# Patient Record
Sex: Female | Born: 1975 | Race: Black or African American | Hispanic: No | Marital: Single | Smoking: Former smoker
Health system: Southern US, Community
[De-identification: ages and names within clinical notes are randomized; demographics above are authoritative.]

## PROBLEM LIST (undated history)

## (undated) DIAGNOSIS — Z8669 Personal history of other diseases of the nervous system and sense organs: Secondary | ICD-10-CM

## (undated) DIAGNOSIS — K219 Gastro-esophageal reflux disease without esophagitis: Secondary | ICD-10-CM

## (undated) HISTORY — PX: BREAST SURGERY: SHX581

---

## 2009-02-19 ENCOUNTER — Emergency Department (HOSPITAL_COMMUNITY): Admission: EM | Admit: 2009-02-19 | Discharge: 2009-02-19 | Payer: Self-pay | Admitting: Emergency Medicine

## 2010-07-05 LAB — COMPREHENSIVE METABOLIC PANEL
ALT: 24 U/L (ref 0–35)
AST: 24 U/L (ref 0–37)
Albumin: 4.3 g/dL (ref 3.5–5.2)
Chloride: 111 mEq/L (ref 96–112)
Creatinine, Ser: 0.56 mg/dL (ref 0.4–1.2)
GFR calc Af Amer: >60 mL/min (ref >60)
Sodium: 141 mEq/L (ref 135–145)
Total Bilirubin: 0.4 mg/dL (ref 0.3–1.2)

## 2010-07-05 LAB — DIFFERENTIAL
Eosinophils Relative: 1 % (ref 0–5)
Lymphocytes Relative: 19 % (ref 12–46)
Lymphs Abs: 1.9 10*3/uL (ref 0.7–4.0)
Monocytes Absolute: 0.3 10*3/uL (ref 0.1–1.0)

## 2010-07-05 LAB — URINALYSIS, ROUTINE W REFLEX MICROSCOPIC
Glucose, UA: NEGATIVE mg/dL
Hgb urine dipstick: NEGATIVE
Specific Gravity, Urine: 1.016 (ref 1.005–1.030)

## 2010-07-05 LAB — CBC
MCV: 98.1 fL (ref 78.0–100.0)
Platelets: 368 10*3/uL (ref 150–400)
WBC: 10.3 10*3/uL (ref 4.0–10.5)

## 2010-07-05 LAB — ETHANOL: Alcohol, Ethyl (B): 24 mg/dL — ABNORMAL HIGH (ref 0–10)

## 2010-08-16 ENCOUNTER — Emergency Department (HOSPITAL_COMMUNITY)
Admission: EM | Admit: 2010-08-16 | Discharge: 2010-08-16 | Disposition: A | Discharge status: Home / Self Care | Payer: Medicaid Other | Attending: Emergency Medicine | Admitting: Emergency Medicine

## 2010-08-16 DIAGNOSIS — K5289 Other specified noninfective gastroenteritis and colitis: Secondary | ICD-10-CM | POA: Insufficient documentation

## 2010-08-16 DIAGNOSIS — N390 Urinary tract infection, site not specified: Secondary | ICD-10-CM | POA: Insufficient documentation

## 2010-08-16 LAB — CBC
Hemoglobin: 14.3 g/dL (ref 12.0–15.0)
MCH: 32.4 pg (ref 26.0–34.0)
MCV: 92.8 fL (ref 78.0–100.0)
RBC: 4.42 MIL/uL (ref 3.87–5.11)

## 2010-08-16 LAB — COMPREHENSIVE METABOLIC PANEL
AST: 19 U/L (ref 0–37)
BUN: 15 mg/dL (ref 6–23)
CO2: 23 mEq/L (ref 19–32)
Chloride: 105 mEq/L (ref 96–112)
Creatinine, Ser: 0.77 mg/dL (ref 0.4–1.2)
GFR calc Af Amer: >60 mL/min (ref >60)
GFR calc non Af Amer: >60 mL/min (ref >60)
Total Bilirubin: 0.3 mg/dL (ref 0.3–1.2)

## 2010-08-16 LAB — URINALYSIS, ROUTINE W REFLEX MICROSCOPIC
Glucose, UA: NEGATIVE mg/dL
Ketones, ur: 40 mg/dL — AB
Leukocytes, UA: NEGATIVE
Protein, ur: 30 mg/dL — AB

## 2010-08-16 LAB — DIFFERENTIAL
Lymphocytes Relative: 18 % (ref 12–46)
Lymphs Abs: 1.6 10*3/uL (ref 0.7–4.0)
Monocytes Relative: 4 % (ref 3–12)
Neutro Abs: 7.1 10*3/uL (ref 1.7–7.7)
Neutrophils Relative %: 77 % (ref 43–77)

## 2010-08-16 LAB — URINE MICROSCOPIC-ADD ON

## 2010-08-17 LAB — URINE CULTURE: Culture  Setup Time: 201205160852

## 2011-03-06 ENCOUNTER — Encounter: Payer: Self-pay | Admitting: *Deleted

## 2011-03-06 ENCOUNTER — Emergency Department (HOSPITAL_COMMUNITY)
Admission: EM | Admit: 2011-03-06 | Discharge: 2011-03-06 | Disposition: A | Discharge status: Home / Self Care | Payer: Medicaid Other | Attending: Emergency Medicine | Admitting: Emergency Medicine

## 2011-03-06 ENCOUNTER — Emergency Department (HOSPITAL_COMMUNITY): Payer: Medicaid Other

## 2011-03-06 DIAGNOSIS — K5289 Other specified noninfective gastroenteritis and colitis: Secondary | ICD-10-CM | POA: Insufficient documentation

## 2011-03-06 DIAGNOSIS — K529 Noninfective gastroenteritis and colitis, unspecified: Secondary | ICD-10-CM

## 2011-03-06 DIAGNOSIS — R109 Unspecified abdominal pain: Secondary | ICD-10-CM | POA: Insufficient documentation

## 2011-03-06 DIAGNOSIS — R197 Diarrhea, unspecified: Secondary | ICD-10-CM | POA: Insufficient documentation

## 2011-03-06 DIAGNOSIS — R112 Nausea with vomiting, unspecified: Secondary | ICD-10-CM | POA: Insufficient documentation

## 2011-03-06 LAB — CBC
HCT: 40.8 % (ref 36.0–46.0)
MCH: 33.2 pg (ref 26.0–34.0)
MCHC: 35.5 g/dL (ref 30.0–36.0)
MCV: 93.4 fL (ref 78.0–100.0)
RDW: 13.6 % (ref 11.5–15.5)

## 2011-03-06 LAB — URINE MICROSCOPIC-ADD ON

## 2011-03-06 LAB — URINALYSIS, ROUTINE W REFLEX MICROSCOPIC
Glucose, UA: NEGATIVE mg/dL
Leukocytes, UA: NEGATIVE
Nitrite: NEGATIVE
Protein, ur: NEGATIVE mg/dL
pH: 7 (ref 5.0–8.0)

## 2011-03-06 LAB — DIFFERENTIAL
Basophils Absolute: 0 10*3/uL (ref 0.0–0.1)
Basophils Relative: 0 % (ref 0–1)
Eosinophils Relative: 1 % (ref 0–5)
Monocytes Absolute: 0.7 10*3/uL (ref 0.1–1.0)
Monocytes Relative: 6 % (ref 3–12)

## 2011-03-06 LAB — COMPREHENSIVE METABOLIC PANEL
AST: 25 U/L (ref 0–37)
Albumin: 4 g/dL (ref 3.5–5.2)
BUN: 14 mg/dL (ref 6–23)
Calcium: 9.6 mg/dL (ref 8.4–10.5)
Creatinine, Ser: 0.7 mg/dL (ref 0.50–1.10)
GFR calc non Af Amer: >90 mL/min (ref >90)

## 2011-03-06 LAB — LIPASE, BLOOD: Lipase: 27 U/L (ref 11–59)

## 2011-03-06 LAB — PREGNANCY, URINE: Preg Test, Ur: NEGATIVE

## 2011-03-06 MED ORDER — SODIUM CHLORIDE 0.9 % IV SOLN
Freq: Once | INTRAVENOUS | Status: AC
  Administered 2011-03-06: 04:00:00 via INTRAVENOUS

## 2011-03-06 MED ORDER — LORAZEPAM 2 MG/ML IJ SOLN
2.0000 mg | Freq: Once | INTRAMUSCULAR | Status: AC
  Administered 2011-03-06: 2 mg via INTRAVENOUS
  Filled 2011-03-06: qty 1

## 2011-03-06 MED ORDER — OXYCODONE-ACETAMINOPHEN 5-325 MG PO TABS: 2.0000 | ORAL_TABLET | ORAL | Status: AC | PRN

## 2011-03-06 MED ORDER — KETOROLAC TROMETHAMINE 30 MG/ML IJ SOLN
30.0000 mg | Freq: Once | INTRAMUSCULAR | Status: AC
  Administered 2011-03-06: 30 mg via INTRAVENOUS
  Filled 2011-03-06: qty 1

## 2011-03-06 MED ORDER — ONDANSETRON HCL 4 MG/2ML IJ SOLN
4.0000 mg | Freq: Once | INTRAMUSCULAR | Status: AC
  Administered 2011-03-06: 4 mg via INTRAVENOUS
  Filled 2011-03-06: qty 2

## 2011-03-06 NOTE — ED Notes (Signed)
PT unable to void at this time. 

## 2011-03-06 NOTE — ED Provider Notes (Signed)
History     CSN: [id number] Arrival date & time: 03/06/2011  2:51 AM   First MD Initiated Contact with Patient 03/06/11 [phone]      Chief Complaint  Patient presents with  . Nausea  . Emesis  . Diarrhea    (Consider location/radiation/quality/duration/timing/severity/associated sxs/prior treatment) HPI Comments: Started 2 hours pta with severe diarrhea, abdominal cramping.  Does not add much additional history as she is screaming, writhing, and is behaving dramatically.  Patient is a 35 y.o. female presenting with diarrhea and cramps. The history is provided by the patient.  Diarrhea The primary symptoms include abdominal pain, nausea and diarrhea. Primary symptoms do not include fever.  Abdominal Cramping The primary symptoms of the illness include abdominal pain, nausea and diarrhea. The primary symptoms of the illness do not include fever. The current episode started 1 to 2 hours ago. The onset of the illness was sudden. The problem has been rapidly worsening.    No past medical history on file.  No past surgical history on file.  No family history on file.  History  Substance Use Topics  . Smoking status: Not on file  . Smokeless tobacco: Not on file  . Alcohol Use: Not on file    OB History    No data available      Review of Systems  Constitutional: Negative for fever.  Gastrointestinal: Positive for nausea, abdominal pain and diarrhea.  All other systems reviewed and are negative.    Allergies  Penicillins  Home Medications  No current outpatient prescriptions on file.  BP 112/74  Pulse 66  Temp(Src) 97.4 F (36.3 C) (Oral)  Resp 22  SpO2 100%  Physical Exam  Nursing note and vitals reviewed. Constitutional: She is oriented to person, place, and time. She appears well-developed and well-nourished.       Screaming, yelling.  Neck: Normal range of motion. Neck supple.  Cardiovascular: Normal rate and regular rhythm.  Exam reveals no gallop and no  friction rub.   No murmur heard. Pulmonary/Chest: Effort normal and breath sounds normal. No respiratory distress. She has no wheezes.  Abdominal: Soft. Bowel sounds are normal. She exhibits no distension. There is no tenderness.  Musculoskeletal: Normal range of motion. She exhibits no edema.  Neurological: She is alert and oriented to person, place, and time.  Skin: Skin is warm and dry.    ED Course  Procedures (including critical care time)  Labs Reviewed - No data to display No results found.   No diagnosis found.    MDM  Feels fine after ivf and meds.  Has had this several times now where her abdomen cramps like this.  Seems to occur after eating lettuce or tomatoes.  Will treat with pain meds, recommend prilosec.        Geoffery Lyons, MD 03/06/11 [phone]

## 2011-03-06 NOTE — ED Notes (Signed)
Per EMS; pt from home; pt having n/v/d X2 hours, abd pain, upper gastric; had to clean pt on arrival

## 2012-01-15 ENCOUNTER — Emergency Department (INDEPENDENT_AMBULATORY_CARE_PROVIDER_SITE_OTHER)
Admission: EM | Admit: 2012-01-15 | Discharge: 2012-01-15 | Disposition: A | Discharge status: Home / Self Care | Payer: Self-pay | Source: Home / Self Care | Attending: Emergency Medicine | Admitting: Emergency Medicine

## 2012-01-15 ENCOUNTER — Encounter (HOSPITAL_COMMUNITY): Payer: Self-pay | Admitting: *Deleted

## 2012-01-15 DIAGNOSIS — G43909 Migraine, unspecified, not intractable, without status migrainosus: Secondary | ICD-10-CM

## 2012-01-15 HISTORY — DX: Gastro-esophageal reflux disease without esophagitis: K21.9

## 2012-01-15 HISTORY — DX: Personal history of other diseases of the nervous system and sense organs: Z86.69

## 2012-01-15 MED ORDER — DIPHENHYDRAMINE HCL 50 MG/ML IJ SOLN
INTRAMUSCULAR | Status: AC
  Filled 2012-01-15: qty 1

## 2012-01-15 MED ORDER — KETOROLAC TROMETHAMINE 60 MG/2ML IM SOLN
60.0000 mg | Freq: Once | INTRAMUSCULAR | Status: AC
  Administered 2012-01-15: 60 mg via INTRAMUSCULAR

## 2012-01-15 MED ORDER — SUMATRIPTAN SUCCINATE 50 MG PO TABS: 50.0000 mg | ORAL_TABLET | ORAL | Status: DC | PRN

## 2012-01-15 MED ORDER — DIPHENHYDRAMINE HCL 50 MG/ML IJ SOLN
25.0000 mg | Freq: Once | INTRAMUSCULAR | Status: AC
  Administered 2012-01-15: 25 mg via INTRAMUSCULAR

## 2012-01-15 MED ORDER — KETOROLAC TROMETHAMINE 60 MG/2ML IM SOLN
INTRAMUSCULAR | Status: AC
  Filled 2012-01-15: qty 2

## 2012-01-15 MED ORDER — DEXAMETHASONE SODIUM PHOSPHATE 10 MG/ML IJ SOLN
INTRAMUSCULAR | Status: AC
  Filled 2012-01-15: qty 1

## 2012-01-15 MED ORDER — DEXAMETHASONE SODIUM PHOSPHATE 10 MG/ML IJ SOLN
10.0000 mg | Freq: Once | INTRAMUSCULAR | Status: AC
  Administered 2012-01-15: 10 mg via INTRAMUSCULAR

## 2012-01-15 NOTE — ED Notes (Signed)
C/o photophobia and blurry vision.  Lights out in the room and pt. wearing sunglasses.

## 2012-01-15 NOTE — ED Provider Notes (Signed)
Chief Complaint  Patient presents with  . Migraine    History of Present Illness:   Kristin Huynh is a 36 year old female who has had a two-year history of migraine headaches. She had been referred to a neurologist but never went. Her migraines vary in frequency, the previous one being this past Sunday, 3 days ago. This went away, but around 3 PM today she noted severe, throbbing, right temporal pain without radiation. There was no obvious precipitating factor. She denies any nausea or vomiting but does have photophobia and phonophobia. The vision in her right eye seems blurry and she's had some numbness around the right eye. She denies any fever, chills, or stiff neck. She's had no numbness or tingling of the arms or legs or focal weakness of the arms or legs. She denies any difficulty with speech, swallowing, or ambulation. She has never taken any prescription medicine for the migraines and has never taken any medicine for migraine prevention.  Review of Systems:  Other than noted above, the patient denies any of the following symptoms: Systemic:  No fever, chills, fatigue, photophobia, stiff neck. Eye:  No redness, eye pain, discharge, blurred vision, or diplopia. ENT:  No nasal congestion, rhinorrhea, sinus pressure or pain, sneezing, earache, or sore throat.  No jaw claudication. Neuro:  No paresthesias, loss of consciousness, seizure activity, muscle weakness, trouble with coordination or gait, trouble speaking or swallowing. Psych:  No depression, anxiety or trouble sleeping.  PMFSH:  Past medical history, family history, social history, meds, and allergies were reviewed.  Physical Exam:   Vital signs:  BP 120/73  Pulse 66  Temp 98 F (36.7 C) (Oral)  Resp 16  SpO2 100%  LMP 01/07/2012 General:  Alert and oriented.  In no distress. Eye:  Lids and conjunctivas normal.  PERRL,  Full EOMs.  Fundi benign with normal discs and vessels. ENT:  No cranial or facial tenderness to palpation.  TMs and  canals clear.  Nasal mucosa was normal and uncongested without any drainage. No intra oral lesions, pharynx clear, mucous membranes moist, dentition normal. Neck:  Supple, full ROM, no tenderness to palpation.  No adenopathy or mass. Neuro:  Alert and orented times 3.  Speech was clear, fluent, and appropriate.  Cranial nerves intact. No pronator drift, muscle strength normal. Finger to nose normal.  DTRs were 2+ and symmetrical.Station and gait were normal.  Romberg's sign was normal.  Able to perform tandem gait well. Psych:  Normal affect.  Medications given in [facility]:  She was given Decadron 10 mg IM, Toradol 60 mg IM, and Benadryl 25 mg IM. She tolerated these all well without any immediate side effects.  Assessment:  The encounter diagnosis was Migraine headache.  Plan:   1.  The following meds were prescribed:   New Prescriptions   SUMATRIPTAN (IMITREX) 50 MG TABLET    Take 1 tablet (50 mg total) by mouth every 2 (two) hours as needed for migraine.   2.  The patient was instructed in symptomatic care and handouts were given. 3.  The patient was told to return if becoming worse in any way, if no better in 3 or 4 days, and given some red flag symptoms that would indicate earlier return.  Follow up:  The patient was told to follow up with Dr. Porfirio Mylar Dohmeier for followup on migraine headaches.     Reuben Likes, MD 01/15/12 2122

## 2012-01-15 NOTE — ED Notes (Signed)
C/o migraine headache in R temple onset 1500 while sitting down. Had one on Sunday also on R side.  No known triggers. Has not seen a neurologist. She was referred last year but never went.  Took Aleve @ 1500 without relief.

## 2012-03-07 ENCOUNTER — Encounter (HOSPITAL_COMMUNITY): Payer: Self-pay | Admitting: Cardiology

## 2012-03-07 ENCOUNTER — Emergency Department (HOSPITAL_COMMUNITY): Payer: Medicaid Other

## 2012-03-07 ENCOUNTER — Emergency Department (HOSPITAL_COMMUNITY)
Admission: EM | Admit: 2012-03-07 | Discharge: 2012-03-07 | Disposition: A | Discharge status: Home / Self Care | Payer: Medicaid Other | Attending: Emergency Medicine | Admitting: Emergency Medicine

## 2012-03-07 DIAGNOSIS — R197 Diarrhea, unspecified: Secondary | ICD-10-CM | POA: Insufficient documentation

## 2012-03-07 DIAGNOSIS — Z8719 Personal history of other diseases of the digestive system: Secondary | ICD-10-CM | POA: Insufficient documentation

## 2012-03-07 DIAGNOSIS — Z3202 Encounter for pregnancy test, result negative: Secondary | ICD-10-CM | POA: Insufficient documentation

## 2012-03-07 DIAGNOSIS — R1110 Vomiting, unspecified: Secondary | ICD-10-CM

## 2012-03-07 DIAGNOSIS — R509 Fever, unspecified: Secondary | ICD-10-CM | POA: Insufficient documentation

## 2012-03-07 DIAGNOSIS — R109 Unspecified abdominal pain: Secondary | ICD-10-CM

## 2012-03-07 DIAGNOSIS — R112 Nausea with vomiting, unspecified: Secondary | ICD-10-CM | POA: Insufficient documentation

## 2012-03-07 DIAGNOSIS — G43909 Migraine, unspecified, not intractable, without status migrainosus: Secondary | ICD-10-CM | POA: Insufficient documentation

## 2012-03-07 DIAGNOSIS — R1084 Generalized abdominal pain: Secondary | ICD-10-CM | POA: Insufficient documentation

## 2012-03-07 DIAGNOSIS — R61 Generalized hyperhidrosis: Secondary | ICD-10-CM | POA: Insufficient documentation

## 2012-03-07 DIAGNOSIS — F17200 Nicotine dependence, unspecified, uncomplicated: Secondary | ICD-10-CM | POA: Insufficient documentation

## 2012-03-07 LAB — URINALYSIS, ROUTINE W REFLEX MICROSCOPIC
Glucose, UA: NEGATIVE mg/dL
Leukocytes, UA: NEGATIVE
Nitrite: NEGATIVE
Specific Gravity, Urine: 1.029 (ref 1.005–1.030)
pH: 6 (ref 5.0–8.0)

## 2012-03-07 LAB — CBC WITH DIFFERENTIAL/PLATELET
Basophils Relative: 1 % (ref 0–1)
Eosinophils Absolute: 0.2 10*3/uL (ref 0.0–0.7)
Eosinophils Relative: 2 % (ref 0–5)
Hemoglobin: 14.5 g/dL (ref 12.0–15.0)
MCH: 32.7 pg (ref 26.0–34.0)
MCHC: 34.6 g/dL (ref 30.0–36.0)
MCV: 94.6 fL (ref 78.0–100.0)
Monocytes Relative: 8 % (ref 3–12)
Neutrophils Relative %: 49 % (ref 43–77)

## 2012-03-07 LAB — URINE MICROSCOPIC-ADD ON

## 2012-03-07 LAB — COMPREHENSIVE METABOLIC PANEL
Albumin: 4.5 g/dL (ref 3.5–5.2)
Alkaline Phosphatase: 49 U/L (ref 39–117)
BUN: 11 mg/dL (ref 6–23)
Calcium: 9.9 mg/dL (ref 8.4–10.5)
Creatinine, Ser: 0.65 mg/dL (ref 0.50–1.10)
GFR calc Af Amer: >90 mL/min (ref >90)
Glucose, Bld: 105 mg/dL — ABNORMAL HIGH (ref 70–99)
Potassium: 3.4 mEq/L — ABNORMAL LOW (ref 3.5–5.1)
Total Protein: 8.1 g/dL (ref 6.0–8.3)

## 2012-03-07 LAB — POCT PREGNANCY, URINE: Preg Test, Ur: NEGATIVE

## 2012-03-07 MED ORDER — OXYCODONE-ACETAMINOPHEN 5-325 MG PO TABS: ORAL_TABLET | ORAL | Status: DC

## 2012-03-07 MED ORDER — ONDANSETRON HCL 4 MG/2ML IJ SOLN
4.0000 mg | Freq: Once | INTRAMUSCULAR | Status: AC
  Administered 2012-03-07: 4 mg via INTRAVENOUS
  Filled 2012-03-07: qty 2

## 2012-03-07 MED ORDER — DIPHENHYDRAMINE HCL 50 MG/ML IJ SOLN
25.0000 mg | Freq: Once | INTRAMUSCULAR | Status: AC
  Administered 2012-03-07: 25 mg via INTRAVENOUS
  Filled 2012-03-07: qty 1

## 2012-03-07 MED ORDER — ONDANSETRON HCL 4 MG PO TABS: 4.0000 mg | ORAL_TABLET | Freq: Three times a day (TID) | ORAL | Status: DC | PRN

## 2012-03-07 MED ORDER — MORPHINE SULFATE 4 MG/ML IJ SOLN
4.0000 mg | Freq: Once | INTRAMUSCULAR | Status: AC
  Administered 2012-03-07: 4 mg via INTRAVENOUS
  Filled 2012-03-07: qty 1

## 2012-03-07 MED ORDER — METOCLOPRAMIDE HCL 5 MG/ML IJ SOLN
10.0000 mg | Freq: Once | INTRAMUSCULAR | Status: AC
  Administered 2012-03-07: 10 mg via INTRAVENOUS
  Filled 2012-03-07: qty 2

## 2012-03-07 MED ORDER — METOCLOPRAMIDE HCL 5 MG/ML IJ SOLN: 10.0000 mg | Freq: Once | INTRAMUSCULAR | Status: DC

## 2012-03-07 MED ORDER — SODIUM CHLORIDE 0.9 % IV BOLUS (SEPSIS)
1000.0000 mL | Freq: Once | INTRAVENOUS | Status: AC
  Administered 2012-03-07: 1000 mL via INTRAVENOUS

## 2012-03-07 MED ORDER — IOHEXOL 300 MG/ML  SOLN
20.0000 mL | INTRAMUSCULAR | Status: AC
  Administered 2012-03-07: 20 mL via ORAL

## 2012-03-07 NOTE — ED Notes (Signed)
Pt reports n/v and headache that started last night. Reports chills and fever. Also reports abd pain.

## 2012-03-07 NOTE — ED Provider Notes (Signed)
Kristin Huynh is a 36 y.o. female transferred to CDU from [facility] B. Signout from [facility] as follows: Patient with chronic headache exacerbation nausea vomiting and abdominal pain starting last night she also reports subjective fever and chills. She is transferred to the CDU pending blood work and CT abdomen pelvis.   Pt seen and examined at the bedside she is resting comfortably, tolerating by mouth and reports complete resolution of abdominal pain at this time. Lung sounds are clear bilaterally, heart is a regular rate and rhythm with no murmurs rubs or gallops, abdominal exam shows mild tenderness to palpation of bilateral upper quadrant and no masses or rebound.  Patient is refusing abdominal CT. She states that she's had this several times in the past always comes back negative. This patient has a nonsurgical abdomen I think this is a reasonable request. I have advised her that we can do an abdominal ultrasound with no radiation. Patient is amenable to this option.  Bloodwork, urine and abdominal ultrasound show no abnormality. Patient's tolerating by mouth and had significant resolution in pain. I have advised her to return to the emergency room immediately for any worsening of symptoms.   Pt verbalized understanding and agrees with care plan. Outpatient follow-up and return precautions given.    New Prescriptions   ONDANSETRON (ZOFRAN) 4 MG TABLET    Take 1 tablet (4 mg total) by mouth every 8 (eight) hours as needed for nausea.   OXYCODONE-ACETAMINOPHEN (PERCOCET/ROXICET) 5-325 MG PER TABLET    1 to 2 tabs PO q6hrs  PRN for pain     Results for orders placed during the hospital encounter of 03/07/12  CBC WITH DIFFERENTIAL      Component Value Range   WBC 8.8  4.0 - 10.5 K/uL   RBC 4.43  3.87 - 5.11 MIL/uL   Hemoglobin 14.5  12.0 - 15.0 g/dL   HCT 16.1  09.6 - 04.5 %   MCV 94.6  78.0 - 100.0 fL   MCH 32.7  26.0 - 34.0 pg   MCHC 34.6  30.0 - 36.0 g/dL   RDW 40.9  81.1 - 91.4 %   Platelets  357  150 - 400 K/uL   Neutrophils Relative 49  43 - 77 %   Neutro Abs 4.3  1.7 - 7.7 K/uL   Lymphocytes Relative 41  12 - 46 %   Lymphs Abs 3.6  0.7 - 4.0 K/uL   Monocytes Relative 8  3 - 12 %   Monocytes Absolute 0.7  0.1 - 1.0 K/uL   Eosinophils Relative 2  0 - 5 %   Eosinophils Absolute 0.2  0.0 - 0.7 K/uL   Basophils Relative 1  0 - 1 %   Basophils Absolute 0.1  0.0 - 0.1 K/uL  COMPREHENSIVE METABOLIC PANEL      Component Value Range   Sodium 139  135 - 145 mEq/L   Potassium 3.4 (*) 3.5 - 5.1 mEq/L   Chloride 103  96 - 112 mEq/L   CO2 20  19 - 32 mEq/L   Glucose, Bld 105 (*) 70 - 99 mg/dL   BUN 11  6 - 23 mg/dL   Creatinine, Ser 7.82  0.50 - 1.10 mg/dL   Calcium 9.9  8.4 - 95.6 mg/dL   Total Protein 8.1  6.0 - 8.3 g/dL   Albumin 4.5  3.5 - 5.2 g/dL   AST 22  0 - 37 U/L   ALT 15  0 - 35 U/L  Alkaline Phosphatase 49  39 - 117 U/L   Total Bilirubin 0.4  0.3 - 1.2 mg/dL   GFR calc non Af Amer >90  >90 mL/min   GFR calc Af Amer >90  >90 mL/min  URINALYSIS, ROUTINE W REFLEX MICROSCOPIC      Component Value Range   Color, Urine YELLOW  YELLOW   APPearance HAZY (*) CLEAR   Specific Gravity, Urine 1.029  1.005 - 1.030   pH 6.0  5.0 - 8.0   Glucose, UA NEGATIVE  NEGATIVE mg/dL   Hgb urine dipstick MODERATE (*) NEGATIVE   Bilirubin Urine SMALL (*) NEGATIVE   Ketones, ur >80 (*) NEGATIVE mg/dL   Protein, ur NEGATIVE  NEGATIVE mg/dL   Urobilinogen, UA 1.0  0.0 - 1.0 mg/dL   Nitrite NEGATIVE  NEGATIVE   Leukocytes, UA NEGATIVE  NEGATIVE  POCT PREGNANCY, URINE      Component Value Range   Preg Test, Ur NEGATIVE  NEGATIVE  URINE MICROSCOPIC-ADD ON      Component Value Range   Squamous Epithelial / LPF MANY (*) RARE   WBC, UA 0-2  <3 WBC/hpf   RBC / HPF 3-6  <3 RBC/hpf   Bacteria, UA RARE  RARE   Urine-Other MUCOUS PRESENT     US Abdomen Complete  03/07/2012  *RADIOLOGY REPORT*  Clinical Data:  Abdominal pain and nausea vomiting.  COMPLETE ABDOMINAL ULTRASOUND  Comparison:   CT abdomen pelvis 03/05/2012.  Findings:  Gallbladder:  No gallstones, gallbladder wall thickening, or pericholecystic fluid.  Common bile duct:  1.7 mm.  Liver:  No focal lesion identified.  Within normal limits in parenchymal echogenicity.  IVC:  Appears normal.  Pancreas:  No focal abnormality seen.  Spleen:  7.3 cm.  Right Kidney:  11.0 cm. No hydronephrosis or renal mass.  Left Kidney:  11.8 cm. No hydronephrosis or renal mass.  Abdominal aorta:  No aneurysm identified.  IMPRESSION: Negative abdominal ultrasound.   Original Report Authenticated By: Lacy Duverney, M.D.     Wynetta Emery, PA-C 03/07/12 1547

## 2012-03-07 NOTE — ED Provider Notes (Signed)
History     CSN: [id number]  Arrival date & time 03/07/12  4010   First MD Initiated Contact with Patient 03/07/12 0802      Chief Complaint  Patient presents with  . Emesis  . Headache    (Consider location/radiation/quality/duration/timing/severity/associated sxs/prior treatment) HPI Comments: Patient is a 36 year old female with a history of migraines and acid reflux presents emergency department with chief complaint of emesis and abdominal pain.  Onset of symptoms began last evening around 8 p.m. abdominal pain is described as cramping sensation that is diffuse in nature.  Associated symptoms include headache, diarrhea, hyperemesis, night sweats, chills, fever or.  Patient denies sick contacts, localized abdominal pain, vaginal dc, urinary symptoms, melena, hematochezia, or hematemesis.   The history is provided by the patient.    Past Medical History  Diagnosis Date  . Hx of migraine headaches   . GERD (gastroesophageal reflux disease)     Past Surgical History  Procedure Date  . Breast surgery     R breast mass removed- benign    History reviewed. No pertinent family history.  History  Substance Use Topics  . Smoking status: Current Every Day Smoker -- 0.2 packs/day    Types: Cigarettes  . Smokeless tobacco: Not on file  . Alcohol Use: No    OB History    Grav Para Term Preterm Abortions TAB SAB Ect Mult Living                  Review of Systems  Constitutional: Positive for fever, chills and diaphoresis. Negative for appetite change.  HENT: Negative for congestion, neck stiffness and dental problem.   Eyes: Negative for visual disturbance.  Respiratory: Negative for cough, chest tightness, shortness of breath and wheezing.   Cardiovascular: Negative for chest pain and leg swelling.  Gastrointestinal: Positive for nausea, vomiting and diarrhea. Negative for abdominal pain, constipation, blood in stool, abdominal distention, anal bleeding and rectal pain.   Genitourinary: Negative for dysuria, urgency, frequency, hematuria and flank pain.  Musculoskeletal: Negative for myalgias and arthralgias.  Skin: Negative for rash.  Neurological: Positive for headaches. Negative for dizziness, syncope, speech difficulty, weakness, light-headedness and numbness.  Hematological: Does not bruise/bleed easily.  Psychiatric/Behavioral: Negative for confusion.  All other systems reviewed and are negative.    Allergies  Penicillins  Home Medications   Current Outpatient Rx  Name  Route  Sig  Dispense  Refill  . SUMATRIPTAN SUCCINATE 50 MG PO TABS   Oral   Take 1 tablet (50 mg total) by mouth every 2 (two) hours as needed for migraine.   10 tablet   0     BP 145/98  Pulse 66  Temp 99.1 F (37.3 C) (Oral)  Resp 18  SpO2 100%  Physical Exam  Nursing note and vitals reviewed. Constitutional: Vital signs are normal. She appears well-developed and well-nourished. No distress.  HENT:  Head: Normocephalic and atraumatic.  Mouth/Throat: Uvula is midline, oropharynx is clear and moist and mucous membranes are normal.  Eyes: Conjunctivae normal and EOM are normal. Pupils are equal, round, and reactive to light.  Neck: Normal range of motion and full passive range of motion without pain. Neck supple. No spinous process tenderness and no muscular tenderness present. No rigidity. No Brudzinski's sign noted.  Cardiovascular: Normal rate and regular rhythm.   Pulmonary/Chest: Effort normal and breath sounds normal. No accessory muscle usage. Not tachypneic. No respiratory distress.  Abdominal: Soft. Normal appearance. She exhibits no distension, no ascites,  no pulsatile midline mass and no mass. There is tenderness (diffuse). There is no CVA tenderness. No hernia.  Lymphadenopathy:    She has no cervical adenopathy.  Neurological: She is alert.  Skin: Skin is warm and dry. No rash noted. She is not diaphoretic.  Psychiatric: She has a normal mood and  affect. Her speech is normal and behavior is normal.    ED Course  Procedures (including critical care time)   Labs Reviewed  CBC WITH DIFFERENTIAL  COMPREHENSIVE METABOLIC PANEL  URINALYSIS, ROUTINE W REFLEX MICROSCOPIC   No results found.   No diagnosis found.   BP 145/98  Pulse 66  Temp 99.1 F (37.3 C) (Oral)  Resp 18  SpO2 100%   MDM  Abdominal pain  Pt is seen and examined; Initial history and physical IV fluids, pain meds & antiemetics given. Labs and urine tests have been ordered and pending. Disposition will be pending lab studies and reassessment. Will be signed out to the CDU provider. Pt appears stable for move at this time          Jaci Carrel, [place] 03/13/12 1551

## 2012-03-07 NOTE — ED Notes (Signed)
Sprite given 

## 2012-03-07 NOTE — ED Notes (Signed)
Pt.  Unable to drink the contrast, she reports that it is making her pain increase and also making her nauseated and vomiting.

## 2012-03-07 NOTE — ED Notes (Signed)
Spoke with Lab, They are still working on the urinalysis, reported to PA, [employer]

## 2012-03-13 NOTE — ED Provider Notes (Signed)
Medical screening examination/treatment/procedure(s) were performed by non-physician practitioner and as supervising physician I was immediately available for consultation/collaboration.  Jones Skene, M.D.     Jones Skene, MD 03/13/12 1635

## 2012-03-20 NOTE — ED Provider Notes (Signed)
Medical screening examination/treatment/procedure(s) were performed by non-physician practitioner and as supervising physician I was immediately available for consultation/collaboration.  John-Adam Nakeisha Greenhouse, M.D.     John-Adam Layni Kreamer, MD 03/20/12 1630 

## 2012-06-28 ENCOUNTER — Encounter (HOSPITAL_COMMUNITY): Payer: Self-pay | Admitting: *Deleted

## 2012-06-28 ENCOUNTER — Encounter (HOSPITAL_COMMUNITY): Payer: Self-pay | Admitting: Cardiology

## 2012-06-28 ENCOUNTER — Emergency Department (HOSPITAL_COMMUNITY)
Admission: EM | Admit: 2012-06-28 | Discharge: 2012-06-28 | Discharge status: Against Medical Advice | Payer: Medicaid Other | Source: Home / Self Care

## 2012-06-28 ENCOUNTER — Emergency Department (HOSPITAL_COMMUNITY)
Admission: EM | Admit: 2012-06-28 | Discharge: 2012-06-28 | Disposition: A | Discharge status: Home / Self Care | Payer: Medicaid Other | Attending: Emergency Medicine | Admitting: Emergency Medicine

## 2012-06-28 DIAGNOSIS — Z8719 Personal history of other diseases of the digestive system: Secondary | ICD-10-CM | POA: Insufficient documentation

## 2012-06-28 DIAGNOSIS — R51 Headache: Secondary | ICD-10-CM | POA: Insufficient documentation

## 2012-06-28 DIAGNOSIS — R112 Nausea with vomiting, unspecified: Secondary | ICD-10-CM | POA: Insufficient documentation

## 2012-06-28 DIAGNOSIS — R109 Unspecified abdominal pain: Secondary | ICD-10-CM | POA: Insufficient documentation

## 2012-06-28 DIAGNOSIS — G43909 Migraine, unspecified, not intractable, without status migrainosus: Secondary | ICD-10-CM | POA: Insufficient documentation

## 2012-06-28 DIAGNOSIS — F17200 Nicotine dependence, unspecified, uncomplicated: Secondary | ICD-10-CM | POA: Insufficient documentation

## 2012-06-28 DIAGNOSIS — Z3202 Encounter for pregnancy test, result negative: Secondary | ICD-10-CM | POA: Insufficient documentation

## 2012-06-28 LAB — CBC WITH DIFFERENTIAL/PLATELET
Basophils Absolute: 0 10*3/uL (ref 0.0–0.1)
Basophils Relative: 1 % (ref 0–1)
Eosinophils Relative: 1 % (ref 0–5)
Lymphocytes Relative: 18 % (ref 12–46)
MCHC: 35 g/dL (ref 30.0–36.0)
MCV: 95.6 fL (ref 78.0–100.0)
Monocytes Absolute: 0.3 10*3/uL (ref 0.1–1.0)
Neutro Abs: 5.8 10*3/uL (ref 1.7–7.7)
Platelets: 327 10*3/uL (ref 150–400)
RDW: 13.6 % (ref 11.5–15.5)
WBC: 7.6 10*3/uL (ref 4.0–10.5)

## 2012-06-28 LAB — URINALYSIS, ROUTINE W REFLEX MICROSCOPIC
Ketones, ur: 15 mg/dL — AB
Leukocytes, UA: NEGATIVE
Protein, ur: NEGATIVE mg/dL
Urobilinogen, UA: 0.2 mg/dL (ref 0.0–1.0)

## 2012-06-28 LAB — LIPASE, BLOOD: Lipase: 16 U/L (ref 11–59)

## 2012-06-28 MED ORDER — DIPHENHYDRAMINE HCL 50 MG/ML IJ SOLN
25.0000 mg | Freq: Once | INTRAMUSCULAR | Status: AC
  Administered 2012-06-28: 25 mg via INTRAVENOUS
  Filled 2012-06-28: qty 1

## 2012-06-28 MED ORDER — SODIUM CHLORIDE 0.9 % IV SOLN
INTRAVENOUS | Status: DC
  Administered 2012-06-28: 15:00:00 via INTRAVENOUS

## 2012-06-28 MED ORDER — KETOROLAC TROMETHAMINE 30 MG/ML IJ SOLN
30.0000 mg | Freq: Once | INTRAMUSCULAR | Status: AC
  Administered 2012-06-28: 30 mg via INTRAVENOUS
  Filled 2012-06-28: qty 1

## 2012-06-28 MED ORDER — ONDANSETRON HCL 4 MG/2ML IJ SOLN
4.0000 mg | Freq: Once | INTRAMUSCULAR | Status: DC
  Filled 2012-06-28: qty 2

## 2012-06-28 MED ORDER — METOCLOPRAMIDE HCL 5 MG/ML IJ SOLN
10.0000 mg | Freq: Once | INTRAMUSCULAR | Status: AC
  Administered 2012-06-28: 10 mg via INTRAVENOUS
  Filled 2012-06-28: qty 2

## 2012-06-28 MED ORDER — ONDANSETRON HCL 4 MG PO TABS: 4.0000 mg | ORAL_TABLET | Freq: Three times a day (TID) | ORAL | Status: DC | PRN

## 2012-06-28 NOTE — ED Notes (Signed)
[facility] ED called, patient is now in their department.

## 2012-06-28 NOTE — ED Notes (Signed)
Pt reports onset of migraine headache this morning, developed n/v and abd pain. Reports taking otc migraine meds without relief of symptoms. Hx of migraines - reports vomiting 4 times today. 10/10 sharp pain mid abdomen

## 2012-06-28 NOTE — ED Provider Notes (Signed)
History     CSN: [id number]  Arrival date & time 06/28/12  1418   First MD Initiated Contact with Patient 06/28/12 1422      Chief Complaint  Patient presents with  . Abdominal Pain  . Migraine    (Consider location/radiation/quality/duration/timing/severity/associated sxs/prior treatment) HPI Comments: [name], past medical history remarkable for migraine headaches and GERD, who presents emergency department with chief complaint of headache. Patient states the headache started this morning. It is worsened throughout the day. She states that it is 10 out of 10. She also endorses associated nausea and vomiting. She also states that she is having sharp mid epigastric abdominal pain. She has not tried taking anything to alleviate her symptoms. She denies any measured fever, however she reports subjective fever and chills. She denies any blood in her emesis or stool.    The history is provided by the patient. No language interpreter was used.    Past Medical History  Diagnosis Date  . Hx of migraine headaches   . GERD (gastroesophageal reflux disease)     Past Surgical History  Procedure Laterality Date  . Breast surgery      R breast mass removed- benign    No family history on file.  History  Substance Use Topics  . Smoking status: Current Every Day Smoker -- 0.25 packs/day    Types: Cigarettes  . Smokeless tobacco: Not on file  . Alcohol Use: Yes    OB History   Grav Para Term Preterm Abortions TAB SAB Ect Mult Living                  Review of Systems  All other systems reviewed and are negative.    Allergies  Penicillins  Home Medications   Current Outpatient Rx  Name  Route  Sig  Dispense  Refill  . ondansetron (ZOFRAN) 4 MG tablet   Oral   Take 1 tablet (4 mg total) by mouth every 8 (eight) hours as needed for nausea.   10 tablet   0   . oxyCODONE-acetaminophen (PERCOCET/ROXICET) 5-325 MG per tablet      1 to 2 tabs PO q6hrs   PRN for pain   15 tablet   0   . SUMAtriptan (IMITREX) 50 MG tablet   Oral   Take 1 tablet (50 mg total) by mouth every 2 (two) hours as needed for migraine.   10 tablet   0     BP 157/92  Pulse 65  Temp(Src) 97.5 F (36.4 C) (Oral)  Resp 20  SpO2 100%  LMP 06/20/2012  Physical Exam  Nursing note and vitals reviewed. Constitutional: She is oriented to person, place, and time. She appears well-developed and well-nourished.  HENT:  Head: Normocephalic and atraumatic.  Right Ear: External ear normal.  Left Ear: External ear normal.  Non-tender over temporal artery, no increased pain with chewing.  Eyes: Conjunctivae and EOM are normal. Pupils are equal, round, and reactive to light.  No papilledema  Neck: Normal range of motion. Neck supple.  No pain with neck flexion, no meningismus  Cardiovascular: Normal rate, regular rhythm and normal heart sounds.  Exam reveals no gallop and no friction rub.   No murmur heard. Pulmonary/Chest: Effort normal and breath sounds normal. No respiratory distress. She has no wheezes. She has no rales. She exhibits no tenderness.  Abdominal: Soft. Bowel sounds are normal. She exhibits no distension and no mass. There is no tenderness. There is  no rebound and no guarding.  Diffuse, crampy, abdominal pain, not well localized  Musculoskeletal: Normal range of motion. She exhibits no edema and no tenderness.  Normal gait.  Neurological: She is alert and oriented to person, place, and time. She has normal reflexes.  CN 3-12 intact, no pronator drift, normal shin to heel, normal RAM, sensation and strength intact bilaterally.  Skin: Skin is warm and dry.  Psychiatric: She has a normal mood and affect. Her behavior is normal. Judgment and thought content normal.    ED Course  Procedures (including critical care time)  Labs Reviewed  URINALYSIS, ROUTINE W REFLEX MICROSCOPIC  CBC WITH DIFFERENTIAL   Results for orders placed during the hospital  encounter of 06/28/12  URINALYSIS, ROUTINE W REFLEX MICROSCOPIC      Result Value Range   Color, Urine YELLOW  YELLOW   APPearance CLEAR  CLEAR   Specific Gravity, Urine 1.023  1.005 - 1.030   pH 7.0  5.0 - 8.0   Glucose, UA NEGATIVE  NEGATIVE mg/dL   Hgb urine dipstick NEGATIVE  NEGATIVE   Bilirubin Urine NEGATIVE  NEGATIVE   Ketones, ur 15 (*) NEGATIVE mg/dL   Protein, ur NEGATIVE  NEGATIVE mg/dL   Urobilinogen, UA 0.2  0.0 - 1.0 mg/dL   Nitrite NEGATIVE  NEGATIVE   Leukocytes, UA NEGATIVE  NEGATIVE  CBC WITH DIFFERENTIAL      Result Value Range   WBC 7.6  4.0 - 10.5 K/uL   RBC 4.28  3.87 - 5.11 MIL/uL   Hemoglobin 14.3  12.0 - 15.0 g/dL   HCT 14.7  82.9 - 56.2 %   MCV 95.6  78.0 - 100.0 fL   MCH 33.4  26.0 - 34.0 pg   MCHC 35.0  30.0 - 36.0 g/dL   RDW 13.0  86.5 - 78.4 %   Platelets 327  150 - 400 K/uL   Neutrophils Relative 76  43 - 77 %   Neutro Abs 5.8  1.7 - 7.7 K/uL   Lymphocytes Relative 18  12 - 46 %   Lymphs Abs 1.4  0.7 - 4.0 K/uL   Monocytes Relative 4  3 - 12 %   Monocytes Absolute 0.3  0.1 - 1.0 K/uL   Eosinophils Relative 1  0 - 5 %   Eosinophils Absolute 0.1  0.0 - 0.7 K/uL   Basophils Relative 1  0 - 1 %   Basophils Absolute 0.0  0.0 - 0.1 K/uL  LIPASE, BLOOD      Result Value Range   Lipase 16  11 - 59 U/L  POCT PREGNANCY, URINE      Result Value Range   Preg Test, Ur NEGATIVE  NEGATIVE       1. Headache   2. Nausea and vomiting       MDM  [name] with headache, and nausea and vomiting. Patient has a history of migraine headaches. She believes that this headache is causing her nausea and vomiting.  The headache was not a thunderclap headache, no meningismus. No fevers.  Labs are reassuring.  Will give migraine cocktail and re-evaluate.  3:21 PM Recheck of abdomen.  Still soft and no localized tenderness.  Suspect that the crampy abdominal pain is due to the vomiting.  Patient feeling significantly better with migraine  cocktail.   Will discharge the patient with zofran and return precautions.     Roxy Horseman, PA-C 06/28/12 1657

## 2012-06-28 NOTE — ED Notes (Signed)
Pt reports she developed a headache this morning and has also had some nausea with vomiting. States she tried to take her migraine medication without much relief. States she is sensitive to light. Reports lq abd pain.

## 2012-06-30 NOTE — ED Provider Notes (Signed)
Medical screening examination/treatment/procedure(s) were performed by non-physician practitioner and as supervising physician I was immediately available for consultation/collaboration.   Laray Anger, DO 06/30/12 1118

## 2012-12-31 ENCOUNTER — Encounter (HOSPITAL_COMMUNITY): Payer: Self-pay | Admitting: Emergency Medicine

## 2012-12-31 ENCOUNTER — Emergency Department (HOSPITAL_COMMUNITY)
Admission: EM | Admit: 2012-12-31 | Discharge: 2013-01-01 | Discharge status: Against Medical Advice | Payer: Medicaid Other | Attending: Emergency Medicine | Admitting: Emergency Medicine

## 2012-12-31 DIAGNOSIS — F17200 Nicotine dependence, unspecified, uncomplicated: Secondary | ICD-10-CM | POA: Insufficient documentation

## 2012-12-31 DIAGNOSIS — R55 Syncope and collapse: Secondary | ICD-10-CM | POA: Insufficient documentation

## 2012-12-31 LAB — CBC WITH DIFFERENTIAL/PLATELET
Basophils Absolute: 0 10*3/uL (ref 0.0–0.1)
Basophils Relative: 1 % (ref 0–1)
Eosinophils Absolute: 0.3 10*3/uL (ref 0.0–0.7)
Eosinophils Relative: 5 % (ref 0–5)
HCT: 38.4 % (ref 36.0–46.0)
MCH: 33.7 pg (ref 26.0–34.0)
MCHC: 35.4 g/dL (ref 30.0–36.0)
MCV: 95 fL (ref 78.0–100.0)
Monocytes Absolute: 0.5 10*3/uL (ref 0.1–1.0)
Platelets: 336 10*3/uL (ref 150–400)
RDW: 13.4 % (ref 11.5–15.5)
WBC: 6.9 10*3/uL (ref 4.0–10.5)

## 2012-12-31 LAB — POCT I-STAT, CHEM 8
BUN: 10 mg/dL (ref 6–23)
Calcium, Ion: 1.16 mmol/L (ref 1.12–1.23)
Chloride: 107 mEq/L (ref 96–112)
Creatinine, Ser: 0.9 mg/dL (ref 0.50–1.10)
Glucose, Bld: 98 mg/dL (ref 70–99)
Sodium: 141 mEq/L (ref 135–145)
TCO2: 24 mmol/L (ref 0–100)

## 2012-12-31 LAB — GLUCOSE, CAPILLARY: Glucose-Capillary: 87 mg/dL (ref 70–99)

## 2012-12-31 NOTE — ED Notes (Signed)
Tech tried to go into do Orthostatic vitals but the pt refused because she needed to  "submit her homework"

## 2012-12-31 NOTE — ED Notes (Signed)
Per [facility], family called for syncopal episode witnessed by family, pt passed out while sitting on the bed, sharp Right side headache, Right side abd pain, pt had a second syncope with EMS. Pt's BP 97/67, 138/112, HR 66, RR 28, CBG 100, 100% RA. 20 g LAC, 12 lead unremarkable.

## 2013-01-01 LAB — URINE MICROSCOPIC-ADD ON

## 2013-01-01 LAB — URINALYSIS, ROUTINE W REFLEX MICROSCOPIC
Glucose, UA: NEGATIVE mg/dL
Ketones, ur: 15 mg/dL — AB
Protein, ur: NEGATIVE mg/dL
Urobilinogen, UA: 0.2 mg/dL (ref 0.0–1.0)

## 2013-01-01 LAB — POCT PREGNANCY, URINE: Preg Test, Ur: NEGATIVE

## 2013-01-01 MED ORDER — IBUPROFEN 800 MG PO TABS
800.0000 mg | ORAL_TABLET | Freq: Once | ORAL | Status: AC
Start: 2013-01-01 — End: 2013-01-01
  Administered 2013-01-01: 800 mg via ORAL
  Filled 2013-01-01: qty 1

## 2013-01-01 NOTE — ED Notes (Addendum)
Pt. Adamant she was leaving AMA. She was not staying here any longer. She demanded that her IV be d/c'd at this time. She signed form in computer and left. Explained that she was next to be seen by the MD. Pt. Had been kept informed about the emergent patients in the front rooms and had been updated why the MD had been detained in seeing her. She declined in waiting to see him any longer.

## 2013-01-17 ENCOUNTER — Encounter (HOSPITAL_COMMUNITY): Payer: Self-pay | Admitting: Emergency Medicine

## 2013-01-17 ENCOUNTER — Emergency Department (HOSPITAL_COMMUNITY)
Admission: EM | Admit: 2013-01-17 | Discharge: 2013-01-17 | Disposition: A | Discharge status: Home / Self Care | Payer: Medicaid Other | Attending: Emergency Medicine | Admitting: Emergency Medicine

## 2013-01-17 DIAGNOSIS — K5289 Other specified noninfective gastroenteritis and colitis: Secondary | ICD-10-CM | POA: Insufficient documentation

## 2013-01-17 DIAGNOSIS — Z8679 Personal history of other diseases of the circulatory system: Secondary | ICD-10-CM | POA: Insufficient documentation

## 2013-01-17 DIAGNOSIS — R112 Nausea with vomiting, unspecified: Secondary | ICD-10-CM

## 2013-01-17 DIAGNOSIS — Z8719 Personal history of other diseases of the digestive system: Secondary | ICD-10-CM | POA: Insufficient documentation

## 2013-01-17 DIAGNOSIS — Z880 Allergy status to penicillin: Secondary | ICD-10-CM | POA: Insufficient documentation

## 2013-01-17 DIAGNOSIS — F17200 Nicotine dependence, unspecified, uncomplicated: Secondary | ICD-10-CM | POA: Insufficient documentation

## 2013-01-17 DIAGNOSIS — F411 Generalized anxiety disorder: Secondary | ICD-10-CM | POA: Insufficient documentation

## 2013-01-17 DIAGNOSIS — K529 Noninfective gastroenteritis and colitis, unspecified: Secondary | ICD-10-CM

## 2013-01-17 DIAGNOSIS — Z3202 Encounter for pregnancy test, result negative: Secondary | ICD-10-CM | POA: Insufficient documentation

## 2013-01-17 LAB — URINALYSIS, ROUTINE W REFLEX MICROSCOPIC
Bilirubin Urine: NEGATIVE
Glucose, UA: NEGATIVE mg/dL
Ketones, ur: NEGATIVE mg/dL
Leukocytes, UA: NEGATIVE
Protein, ur: NEGATIVE mg/dL
pH: 7.5 (ref 5.0–8.0)

## 2013-01-17 LAB — URINE MICROSCOPIC-ADD ON

## 2013-01-17 MED ORDER — ONDANSETRON HCL 8 MG PO TABS: 8.0000 mg | ORAL_TABLET | Freq: Three times a day (TID) | ORAL | Status: DC | PRN

## 2013-01-17 MED ORDER — SODIUM CHLORIDE 0.9 % IV BOLUS (SEPSIS)
1000.0000 mL | Freq: Once | INTRAVENOUS | Status: AC
  Administered 2013-01-17: 1000 mL via INTRAVENOUS

## 2013-01-17 MED ORDER — LORAZEPAM 2 MG/ML IJ SOLN
1.0000 mg | Freq: Once | INTRAMUSCULAR | Status: AC
  Administered 2013-01-17: 1 mg via INTRAVENOUS
  Filled 2013-01-17: qty 1

## 2013-01-17 MED ORDER — ONDANSETRON HCL 4 MG/2ML IJ SOLN
4.0000 mg | Freq: Once | INTRAMUSCULAR | Status: AC
  Administered 2013-01-17: 4 mg via INTRAVENOUS
  Filled 2013-01-17: qty 2

## 2013-01-17 NOTE — ED Notes (Signed)
She c/o h/a plus several episodes of n/v since early this morning.

## 2013-01-17 NOTE — ED Provider Notes (Signed)
CSN: [id number]     Arrival date & time 01/17/13  1033 History   First MD Initiated Contact with Patient 01/17/13 1109     Chief Complaint  Patient presents with  . Emesis    Also c/o h/a   (Consider location/radiation/quality/duration/timing/severity/associated sxs/prior Treatment) Patient is a 37 y.o. female presenting with vomiting. The history is provided by the patient.  Emesis Associated symptoms: no abdominal pain, no chills, no headaches and no sore throat   pt w nv onset this morning. Several episodes. Clear to yellowish. No bloody emesis. No abd distension. Having normal bms, no diarrhea. Denies known bad food ingestion or ill contacts. No travel. No prior abd surgery. Denies vaginal discharge or bleeding, lnmp 3 weeks ago. Denies dysuria. No fever or chills.     Past Medical History  Diagnosis Date  . Hx of migraine headaches   . GERD (gastroesophageal reflux disease)    Past Surgical History  Procedure Laterality Date  . Breast surgery      R breast mass removed- benign   History reviewed. No pertinent family history. History  Substance Use Topics  . Smoking status: Current Every Day Smoker -- 0.25 packs/day    Types: Cigarettes  . Smokeless tobacco: Not on file  . Alcohol Use: Yes   OB History   Grav Para Term Preterm Abortions TAB SAB Ect Mult Living                 Review of Systems  Constitutional: Negative for fever and chills.  HENT: Negative for sore throat.   Eyes: Negative for redness.  Respiratory: Negative for shortness of breath.   Cardiovascular: Negative for chest pain.  Gastrointestinal: Positive for vomiting. Negative for abdominal pain.  Genitourinary: Negative for flank pain.  Musculoskeletal: Negative for back pain and neck pain.  Skin: Negative for rash.  Neurological: Negative for headaches.  Hematological: Does not bruise/bleed easily.  Psychiatric/Behavioral: Negative for confusion.    Allergies  Penicillins and  Sumatriptan  Home Medications  No current outpatient prescriptions on file. BP 90/44  Pulse 75  Temp(Src) 97.7 F (36.5 C) (Oral)  Resp 18  Wt 130 lb (58.968 kg)  SpO2 98%  LMP 01/03/2013 Physical Exam  Nursing note and vitals reviewed. Constitutional: She appears well-developed and well-nourished. No distress.  HENT:  Mouth/Throat: Oropharynx is clear and moist.  Eyes: Conjunctivae are normal. No scleral icterus.  Neck: Neck supple. No tracheal deviation present.  Cardiovascular: Normal rate.   Pulmonary/Chest: Effort normal. No respiratory distress.  Abdominal: Soft. Normal appearance and bowel sounds are normal. She exhibits no distension and no mass. There is no tenderness. There is no rebound and no guarding.  Genitourinary:  No cva tenderness  Musculoskeletal: She exhibits no edema.  Neurological: She is alert.  Skin: Skin is warm and dry. No rash noted. She is not diaphoretic.  Psychiatric:  anxious    ED Course  Procedures (including critical care time)  Results for orders placed during the hospital encounter of 01/17/13  URINALYSIS, ROUTINE W REFLEX MICROSCOPIC      Result Value Range   Color, Urine YELLOW  YELLOW   APPearance CLOUDY (*) CLEAR   Specific Gravity, Urine 1.021  1.005 - 1.030   pH 7.5  5.0 - 8.0   Glucose, UA NEGATIVE  NEGATIVE mg/dL   Hgb urine dipstick TRACE (*) NEGATIVE   Bilirubin Urine NEGATIVE  NEGATIVE   Ketones, ur NEGATIVE  NEGATIVE mg/dL   Protein, ur NEGATIVE  NEGATIVE mg/dL   Urobilinogen, UA 0.2  0.0 - 1.0 mg/dL   Nitrite NEGATIVE  NEGATIVE   Leukocytes, UA NEGATIVE  NEGATIVE  PREGNANCY, URINE      Result Value Range   Preg Test, Ur NEGATIVE  NEGATIVE  URINE MICROSCOPIC-ADD ON      Result Value Range   Squamous Epithelial / LPF RARE  RARE   WBC, UA 0-2  <3 WBC/hpf   RBC / HPF 0-2  <3 RBC/hpf   Bacteria, UA RARE  RARE      EKG Interpretation   None       MDM  Iv ns bolus. zofran iv.  Labs.  Reviewed nursing  notes and prior charts for additional history.   2nd liter ns iv.  Pt feels much improved.   abd soft nt.  Trial po fluids - tolerates without recurrent nv. abd soft nt.  Pt stable for d/c.    Suzi Roots, MD 01/17/13 [phone]

## 2013-04-07 ENCOUNTER — Emergency Department (HOSPITAL_COMMUNITY)
Admission: EM | Admit: 2013-04-07 | Discharge: 2013-04-07 | Disposition: A | Discharge status: Home / Self Care | Payer: Medicaid Other | Attending: Emergency Medicine | Admitting: Emergency Medicine

## 2013-04-07 ENCOUNTER — Encounter (HOSPITAL_COMMUNITY): Payer: Self-pay | Admitting: Emergency Medicine

## 2013-04-07 DIAGNOSIS — F17200 Nicotine dependence, unspecified, uncomplicated: Secondary | ICD-10-CM | POA: Insufficient documentation

## 2013-04-07 DIAGNOSIS — N898 Other specified noninflammatory disorders of vagina: Secondary | ICD-10-CM | POA: Insufficient documentation

## 2013-04-07 DIAGNOSIS — Z3202 Encounter for pregnancy test, result negative: Secondary | ICD-10-CM | POA: Insufficient documentation

## 2013-04-07 DIAGNOSIS — R197 Diarrhea, unspecified: Secondary | ICD-10-CM | POA: Insufficient documentation

## 2013-04-07 DIAGNOSIS — Z8679 Personal history of other diseases of the circulatory system: Secondary | ICD-10-CM | POA: Insufficient documentation

## 2013-04-07 DIAGNOSIS — Z880 Allergy status to penicillin: Secondary | ICD-10-CM | POA: Insufficient documentation

## 2013-04-07 DIAGNOSIS — Z8719 Personal history of other diseases of the digestive system: Secondary | ICD-10-CM | POA: Insufficient documentation

## 2013-04-07 DIAGNOSIS — R112 Nausea with vomiting, unspecified: Secondary | ICD-10-CM | POA: Insufficient documentation

## 2013-04-07 LAB — URINALYSIS, ROUTINE W REFLEX MICROSCOPIC
BILIRUBIN URINE: NEGATIVE
GLUCOSE, UA: NEGATIVE mg/dL
Hgb urine dipstick: NEGATIVE
KETONES UR: 15 mg/dL — AB
Leukocytes, UA: NEGATIVE
Nitrite: NEGATIVE
Protein, ur: NEGATIVE mg/dL
Specific Gravity, Urine: 1.02 (ref 1.005–1.030)
Urobilinogen, UA: 0.2 mg/dL (ref 0.0–1.0)
pH: 8.5 — ABNORMAL HIGH (ref 5.0–8.0)

## 2013-04-07 LAB — POCT PREGNANCY, URINE: Preg Test, Ur: NEGATIVE

## 2013-04-07 MED ORDER — ONDANSETRON 4 MG PO TBDP: 4.0000 mg | ORAL_TABLET | Freq: Three times a day (TID) | ORAL | Status: DC | PRN

## 2013-04-07 MED ORDER — MORPHINE SULFATE 4 MG/ML IJ SOLN
4.0000 mg | Freq: Once | INTRAMUSCULAR | Status: AC
  Administered 2013-04-07: 4 mg via INTRAVENOUS
  Filled 2013-04-07: qty 1

## 2013-04-07 MED ORDER — ONDANSETRON HCL 4 MG/2ML IJ SOLN
4.0000 mg | Freq: Once | INTRAMUSCULAR | Status: AC
  Administered 2013-04-07: 4 mg via INTRAVENOUS
  Filled 2013-04-07: qty 2

## 2013-04-07 MED ORDER — SODIUM CHLORIDE 0.9 % IV BOLUS (SEPSIS)
1000.0000 mL | Freq: Once | INTRAVENOUS | Status: AC
  Administered 2013-04-07: 1000 mL via INTRAVENOUS

## 2013-04-07 NOTE — ED Notes (Signed)
Bed: WA05 Expected date:  Expected time:  Means of arrival:  Comments: 

## 2013-04-07 NOTE — Discharge Instructions (Signed)
Please read and follow all provided instructions.  Your diagnoses today include:  1. Nausea vomiting and diarrhea     Tests performed today include:  Urine test to look for infection and pregnancy (in women)  Vital signs. See below for your results today.   Medications prescribed:   Zofran (ondansetron) - for nausea and vomiting  Take any prescribed medications only as directed.  Home care instructions:   Follow any educational materials contained in this packet.   Your abdominal pain, nausea, vomiting, and diarrhea may be caused by a viral gastroenteritis also called 'stomach flu'. You should rest for the next several days. Keep drinking plenty of fluids and use the medicine for nausea as directed.    Drink clear liquids for the next 24 hours and introduce solid foods slowly after 24 hours using the b.r.a.t. diet (Bananas, Rice, Applesauce, Toast, Yogurt).    Follow-up instructions: Please follow-up with your primary care provider in the next 2 days for further evaluation of your symptoms. If you are not feeling better in 48 hours you may have a condition that is more serious and you need re-evaluation. If you do not have a primary care doctor -- see below for referral information.   Return instructions:  SEEK IMMEDIATE MEDICAL ATTENTION IF:  If you have pain that does not go away or becomes severe   A temperature above 101F develops   Repeated vomiting occurs (multiple episodes)   If you have pain that becomes localized to portions of the abdomen. The right side could possibly be appendicitis. In an adult, the left lower portion of the abdomen could be colitis or diverticulitis.   Blood is being passed in stools or vomit (bright red or black tarry stools)   You develop chest pain, difficulty breathing, dizziness or fainting, or become confused, poorly responsive, or inconsolable (young children)  If you have any other emergent concerns regarding your health  Additional  Information: Abdominal (belly) pain can be caused by many things. Your caregiver performed an examination and possibly ordered blood/urine tests and imaging (CT scan, x-rays, ultrasound). Many cases can be observed and treated at home after initial evaluation in the emergency department. Even though you are being discharged home, abdominal pain can be unpredictable. Therefore, you need a repeated exam if your pain does not resolve, returns, or worsens. Most patients with abdominal pain don't have to be admitted to the hospital or have surgery, but serious problems like appendicitis and gallbladder attacks can start out as nonspecific pain. Many abdominal conditions cannot be diagnosed in one visit, so follow-up evaluations are very important.  Your vital signs today were: BP 98/60   Pulse 65   Temp(Src) 98 F (36.7 C) (Oral)   Resp 16   SpO2 100%   LMP 04/06/2013 If your blood pressure (bp) was elevated above 135/85 this visit, please have this repeated by your doctor within one month. --------------

## 2013-04-07 NOTE — ED Notes (Signed)
Pt has gastritis, has been vomiting since 0300, c/o abd pain and n/v.

## 2013-04-07 NOTE — ED Provider Notes (Signed)
CSN: [id number]     Arrival date & time 04/07/13  1019 History   First MD Initiated Contact with Patient 04/07/13 1103     Chief Complaint  Patient presents with  . Abdominal Pain   (Consider location/radiation/quality/duration/timing/severity/associated sxs/prior Treatment) HPI Comments: Patient with history of gastritis presents with complaint of lower abdominal cramping, nausea, vomiting, and diarrhea which began acutely this morning at about 3 AM. No treatments prior to arrival. Vomiting is yellow and nonbloody. Diarrhea is watery and nonbloody. Patient denies urinary symptoms. She has had some vaginal discharge since coming off of her menstrual period recently. No known sick contacts. The onset of this condition was acute. The course is constant. Aggravating factors: none. Alleviating factors: none.    Patient is a 38 y.o. female presenting with abdominal pain. The history is provided by the patient and medical records.  Abdominal Pain Associated symptoms: diarrhea, nausea and vomiting   Associated symptoms: no chest pain, no cough, no dysuria, no fever and no sore throat     Past Medical History  Diagnosis Date  . Hx of migraine headaches   . GERD (gastroesophageal reflux disease)    Past Surgical History  Procedure Laterality Date  . Breast surgery      R breast mass removed- benign   No family history on file. History  Substance Use Topics  . Smoking status: Current Every Day Smoker -- 0.25 packs/day    Types: Cigarettes  . Smokeless tobacco: Not on file  . Alcohol Use: Yes   OB History   Grav Para Term Preterm Abortions TAB SAB Ect Mult Living                 Review of Systems  Constitutional: Negative for fever.  HENT: Negative for rhinorrhea and sore throat.   Eyes: Negative for redness.  Respiratory: Negative for cough.   Cardiovascular: Negative for chest pain.  Gastrointestinal: Positive for nausea, vomiting, abdominal pain and diarrhea. Negative for blood  in stool.  Genitourinary: Negative for dysuria.  Musculoskeletal: Negative for myalgias.  Skin: Negative for rash.  Neurological: Negative for headaches.    Allergies  Penicillins and Sumatriptan  Home Medications   Current Outpatient Rx  Name  Route  Sig  Dispense  Refill  . Multiple Vitamin (MULTIVITAMIN WITH MINERALS) TABS tablet   Oral   Take 1 tablet by mouth daily.          BP 117/87  Pulse 107  Temp(Src) 98.4 F (36.9 C) (Oral)  Resp 20  SpO2 100%  LMP 04/06/2013 Physical Exam  Nursing note and vitals reviewed. Constitutional: She appears well-developed and well-nourished.  HENT:  Head: Normocephalic and atraumatic.  Eyes: Conjunctivae are normal. Right eye exhibits no discharge. Left eye exhibits no discharge.  Neck: Normal range of motion. Neck supple.  Cardiovascular: Normal rate, regular rhythm and normal heart sounds.   Pulmonary/Chest: Effort normal and breath sounds normal.  Abdominal: Soft. There is tenderness (mild) in the suprapubic area. There is no rigidity, no rebound, no guarding, no CVA tenderness, no tenderness at McBurney's point and negative Murphy's sign.  Neurological: She is alert.  Skin: Skin is warm and dry.  Psychiatric: She has a normal mood and affect.    ED Course  Procedures (including critical care time) Labs Review Labs Reviewed  URINALYSIS, ROUTINE W REFLEX MICROSCOPIC - Abnormal; Notable for the following:    pH 8.5 (*)    Ketones, ur 15 (*)    All other components  within normal limits  POCT PREGNANCY, URINE   Imaging Review No results found.  EKG Interpretation   None      11:38 AM Patient seen and examined. Work-up initiated. Medications ordered.   Vital signs reviewed and are as follows: Filed Vitals:   04/07/13 1030  BP: 117/87  Pulse: 107  Temp: 98.4 F (36.9 C)  Resp: 20   2:46 PM Patient is feeling much better. She is eating ice chips and has not vomited since arrival. She states the nausea is  improved and abdominal pain is very minimal. Abdomen is soft and nontender on exam.  Pt counseled on clear liquids and brat diet. The patient was urged to return to the Emergency Department immediately with worsening of current symptoms, worsening abdominal pain, persistent vomiting, blood noted in stools, fever, or any other concerns. The patient verbalized understanding.    BP 105/68  Pulse 60  Temp(Src) 98.1 F (36.7 C) (Oral)  Resp 14  SpO2 99%  LMP 04/06/2013    MDM   1. Nausea vomiting and diarrhea    Patient with symptoms consistent with viral gastroenteritis.  UA neg. Vitals are stable, no fever.  No signs of dehydration, tolerating PO's.  Lungs are clear.  No focal abdominal pain, no concern for appendicitis, cholecystitis, pancreatitis, ruptured viscus, UTI, kidney stone, or any other abdominal etiology.  Supportive therapy indicated with return if symptoms worsen.  Patient counseled.     Renne Crigler, PA-C 04/07/13 [phone]

## 2013-04-09 NOTE — ED Provider Notes (Signed)
Medical screening examination/treatment/procedure(s) were performed by non-physician practitioner and as supervising physician I was immediately available for consultation/collaboration.  EKG Interpretation   None         Derwood KaplanAnkit Jantz Main, MD 04/09/13 2123

## 2013-08-16 ENCOUNTER — Encounter (HOSPITAL_COMMUNITY): Payer: Self-pay | Admitting: Emergency Medicine

## 2013-08-16 DIAGNOSIS — R51 Headache: Secondary | ICD-10-CM | POA: Insufficient documentation

## 2013-08-16 NOTE — ED Notes (Addendum)
Pt reports headache x 3 days that "sits right behind my eyes" that is progressively worsening. Reports photophobia, blurred vision. Denies nausea. Pt ambulatory to triage. Neuro intact. AND. States she took Excedrin this AM with minimal relief. Hx: migraines

## 2013-08-17 ENCOUNTER — Emergency Department (HOSPITAL_COMMUNITY)
Admission: EM | Admit: 2013-08-17 | Discharge: 2013-08-17 | Discharge status: Against Medical Advice | Payer: Medicaid Other | Attending: Emergency Medicine | Admitting: Emergency Medicine

## 2013-08-17 NOTE — ED Notes (Signed)
No answer. Unable to find. Not in w/r,h/w, triage, b/r or entry way.   

## 2014-03-09 ENCOUNTER — Emergency Department (HOSPITAL_COMMUNITY): Payer: Medicaid Other

## 2014-03-09 ENCOUNTER — Emergency Department (HOSPITAL_COMMUNITY)
Admission: EM | Admit: 2014-03-09 | Discharge: 2014-03-09 | Disposition: A | Discharge status: Home / Self Care | Payer: Self-pay | Attending: Emergency Medicine | Admitting: Emergency Medicine

## 2014-03-09 ENCOUNTER — Encounter (HOSPITAL_COMMUNITY): Payer: Self-pay

## 2014-03-09 DIAGNOSIS — R519 Headache, unspecified: Secondary | ICD-10-CM

## 2014-03-09 DIAGNOSIS — Z3202 Encounter for pregnancy test, result negative: Secondary | ICD-10-CM | POA: Insufficient documentation

## 2014-03-09 DIAGNOSIS — Z79899 Other long term (current) drug therapy: Secondary | ICD-10-CM | POA: Insufficient documentation

## 2014-03-09 DIAGNOSIS — Z8719 Personal history of other diseases of the digestive system: Secondary | ICD-10-CM | POA: Insufficient documentation

## 2014-03-09 DIAGNOSIS — Z720 Tobacco use: Secondary | ICD-10-CM | POA: Insufficient documentation

## 2014-03-09 DIAGNOSIS — R51 Headache: Secondary | ICD-10-CM

## 2014-03-09 DIAGNOSIS — G43009 Migraine without aura, not intractable, without status migrainosus: Secondary | ICD-10-CM

## 2014-03-09 DIAGNOSIS — Z880 Allergy status to penicillin: Secondary | ICD-10-CM | POA: Insufficient documentation

## 2014-03-09 DIAGNOSIS — G43001 Migraine without aura, not intractable, with status migrainosus: Secondary | ICD-10-CM | POA: Insufficient documentation

## 2014-03-09 LAB — POC URINE PREG, ED: Preg Test, Ur: NEGATIVE

## 2014-03-09 MED ORDER — KETOROLAC TROMETHAMINE 30 MG/ML IJ SOLN
30.0000 mg | Freq: Once | INTRAMUSCULAR | Status: AC
  Administered 2014-03-09: 30 mg via INTRAVENOUS
  Filled 2014-03-09: qty 1

## 2014-03-09 MED ORDER — SODIUM CHLORIDE 0.9 % IV BOLUS (SEPSIS)
1000.0000 mL | Freq: Once | INTRAVENOUS | Status: AC
  Administered 2014-03-09: 1000 mL via INTRAVENOUS

## 2014-03-09 MED ORDER — METOCLOPRAMIDE HCL 5 MG/ML IJ SOLN
10.0000 mg | Freq: Once | INTRAMUSCULAR | Status: AC
  Administered 2014-03-09: 10 mg via INTRAVENOUS
  Filled 2014-03-09: qty 2

## 2014-03-09 MED ORDER — DIPHENHYDRAMINE HCL 50 MG/ML IJ SOLN
25.0000 mg | Freq: Once | INTRAMUSCULAR | Status: AC
  Administered 2014-03-09: 25 mg via INTRAVENOUS
  Filled 2014-03-09: qty 1

## 2014-03-09 NOTE — ED Notes (Signed)
Patient instructed that a urine specimen was needed prior to having CT scan.

## 2014-03-09 NOTE — ED Notes (Signed)
Patient transported to CT 

## 2014-03-09 NOTE — ED Provider Notes (Signed)
CSN: [id number]     Arrival date & time 03/09/14  [id number] History   First MD Initiated Contact with Patient 03/09/14 [phone]     Chief Complaint  Patient presents with  . Migraine     (Consider location/radiation/quality/duration/timing/severity/associated sxs/prior Treatment) HPI  Pt presenting with c/o right sided headache similar to her previous migraine headaches.  However she states that this is more painful than her prior migraines.  She also states that the right side of her face feels numb.  No fever/chills.  No neck pain.  No changes in vision or speech.  No head trauma.  Headache began yesterday and has been gradually worsening.  She tried ibuprofen yesterday without relief.  No weakness of arms or legs.  No vomiting, but has felt nauseated.  Headache is behind her right eye, constant and throbbing.  There are no other associated systemic symptoms, there are no other alleviating or modifying factors.   Past Medical History  Diagnosis Date  . Hx of migraine headaches   . GERD (gastroesophageal reflux disease)    Past Surgical History  Procedure Laterality Date  . Breast surgery      R breast mass removed- benign   Family History  Problem Relation Age of Onset  . Cancer Mother   . Diabetes Father    History  Substance Use Topics  . Smoking status: Current Every Day Smoker -- 0.25 packs/day    Types: Cigarettes  . Smokeless tobacco: Never Used  . Alcohol Use: Yes     Comment: occasionally   OB History    No data available     Review of Systems  ROS reviewed and all otherwise negative except for mentioned in HPI    Allergies  Penicillins and Sumatriptan  Home Medications   Prior to Admission medications   Medication Sig Start Date End Date Taking? Authorizing Provider  Multiple Vitamin (MULTIVITAMIN WITH MINERALS) TABS tablet Take 1 tablet by mouth daily.   Yes Historical Provider, MD  ondansetron (ZOFRAN ODT) 4 MG disintegrating tablet Take 1 tablet (4 mg total)  by mouth every 8 (eight) hours as needed for nausea or vomiting. Patient not taking: Reported on 03/09/2014 04/07/13   Renne CriglerJoshua Geiple, PA-C   BP 101/85 mmHg  Pulse 68  Temp(Src) 98.1 F (36.7 C) (Oral)  Resp 16  SpO2 99%  LMP 02/06/2014  Vitals reviewed Physical Exam  Physical Examination: General appearance - alert, well appearing, and in no distress Mental status - alert, oriented to person, place, and time Eyes - pupils equal and reactive, extraocular eye movements intact Mouth - mucous membranes moist, pharynx normal without lesions Chest - clear to auscultation, no wheezes, rales or rhonchi, symmetric air entry Heart - normal rate, regular rhythm, normal S1, S2, no murmurs, rubs, clicks or gallops Abdomen - soft, nontender, nondistended, no masses or organomegaly Neurological - alert, oriented x 3, normal speech, cranial nerves 2-12 tested and intact, subjective decreased sensation over right jaw Extremities - peripheral pulses normal, no pedal edema, no clubbing or cyanosis Skin - normal coloration and turgor, no rashes  ED Course  Procedures (including critical care time)  11:50 AM d/w radiology- reads not crossing over, head CT read as negative, unenhanced CT of the brain.   Labs Review Labs Reviewed  POC URINE PREG, ED    Imaging Review Ct Head Wo Contrast  03/09/2014   CLINICAL DATA:  Right-sided migraine headache began yesterday, worsening today  EXAM: CT HEAD WITHOUT CONTRAST  TECHNIQUE: Contiguous  axial images were obtained from the base of the skull through the vertex without intravenous contrast.  COMPARISON:  None.  FINDINGS: The ventricular system is normal in size and configuration, and the septum is in a normal midline position. The fourth ventricle and basilar cisterns are unremarkable. No hemorrhage, mass lesion, or acute infarction is seen. On bone window images, no calvarial abnormality is seen. The paranasal sinuses appear pneumatized.  IMPRESSION: Negative  unenhanced CT of the brain.   Electronically Signed   By: Dwyane DeePaul  Barry M.D.   On: 03/09/2014 11:11     EKG Interpretation None      MDM   Final diagnoses:  Headache  Migraine without aura and without status migrainosus, not intractable    Pt presenting with headache similar to her prior migraines, gradual in onset.  Pt feeling improved after migraine cocktail.  Head CT obtained due to numbness of face which is a new finding, this resolved after meds for migraine- doubt TIA or CVA .  Discharged with strict return precautions.  Pt agreeable with plan.    Ethelda ChickMartha K Linker, MD 03/09/14 [phone]

## 2014-03-09 NOTE — ED Notes (Signed)
Per EMS- Patient c/o right sided migraine that began yesterday at 1700 and is worse today. Patient denies any N/V. Patient states pain is worse today and rates 10/10.

## 2014-03-09 NOTE — Discharge Instructions (Signed)
Return to the ED with any concerns including vomiting and not able to keep down liquids, changes in vision or speech, weakness of arms or legs, fainting, decreased level of alertness/lethargy, or any other alarming symptoms °

## 2014-03-09 NOTE — ED Notes (Signed)
Bed: WA17 Expected date:  Expected time:  Means of arrival:  Comments: EMS-migraine

## 2014-06-14 ENCOUNTER — Encounter (HOSPITAL_COMMUNITY): Payer: Self-pay

## 2014-06-14 ENCOUNTER — Emergency Department (HOSPITAL_COMMUNITY): Payer: Medicaid Other

## 2014-06-14 ENCOUNTER — Emergency Department (HOSPITAL_COMMUNITY)
Admission: EM | Admit: 2014-06-14 | Discharge: 2014-06-14 | Disposition: A | Discharge status: Home / Self Care | Payer: Medicaid Other | Attending: Emergency Medicine | Admitting: Emergency Medicine

## 2014-06-14 DIAGNOSIS — Z880 Allergy status to penicillin: Secondary | ICD-10-CM | POA: Insufficient documentation

## 2014-06-14 DIAGNOSIS — R079 Chest pain, unspecified: Secondary | ICD-10-CM

## 2014-06-14 DIAGNOSIS — Z8719 Personal history of other diseases of the digestive system: Secondary | ICD-10-CM | POA: Insufficient documentation

## 2014-06-14 DIAGNOSIS — M549 Dorsalgia, unspecified: Secondary | ICD-10-CM | POA: Insufficient documentation

## 2014-06-14 DIAGNOSIS — Z720 Tobacco use: Secondary | ICD-10-CM | POA: Insufficient documentation

## 2014-06-14 DIAGNOSIS — M546 Pain in thoracic spine: Secondary | ICD-10-CM | POA: Insufficient documentation

## 2014-06-14 LAB — CBC WITH DIFFERENTIAL/PLATELET
BASOS ABS: 0.1 10*3/uL (ref 0.0–0.1)
BASOS PCT: 1 % (ref 0–1)
Eosinophils Absolute: 0.1 10*3/uL (ref 0.0–0.7)
Eosinophils Relative: 2 % (ref 0–5)
HEMATOCRIT: 39.7 % (ref 36.0–46.0)
Hemoglobin: 13.4 g/dL (ref 12.0–15.0)
Lymphocytes Relative: 45 % (ref 12–46)
Lymphs Abs: 2.5 10*3/uL (ref 0.7–4.0)
MCH: 32.3 pg (ref 26.0–34.0)
MCHC: 33.8 g/dL (ref 30.0–36.0)
MCV: 95.7 fL (ref 78.0–100.0)
MONO ABS: 0.4 10*3/uL (ref 0.1–1.0)
Monocytes Relative: 7 % (ref 3–12)
Neutro Abs: 2.5 10*3/uL (ref 1.7–7.7)
Neutrophils Relative %: 45 % (ref 43–77)
PLATELETS: 327 10*3/uL (ref 150–400)
RBC: 4.15 MIL/uL (ref 3.87–5.11)
RDW: 13 % (ref 11.5–15.5)
WBC: 5.6 10*3/uL (ref 4.0–10.5)

## 2014-06-14 LAB — URINE MICROSCOPIC-ADD ON

## 2014-06-14 LAB — URINALYSIS, ROUTINE W REFLEX MICROSCOPIC
BILIRUBIN URINE: NEGATIVE
Glucose, UA: NEGATIVE mg/dL
Ketones, ur: NEGATIVE mg/dL
LEUKOCYTES UA: NEGATIVE
Nitrite: NEGATIVE
PH: 6 (ref 5.0–8.0)
Protein, ur: NEGATIVE mg/dL
SPECIFIC GRAVITY, URINE: 1.023 (ref 1.005–1.030)
UROBILINOGEN UA: 0.2 mg/dL (ref 0.0–1.0)

## 2014-06-14 LAB — COMPREHENSIVE METABOLIC PANEL
ALBUMIN: 3.8 g/dL (ref 3.5–5.2)
ALT: 18 U/L (ref 0–35)
ANION GAP: 4 — AB (ref 5–15)
AST: 20 U/L (ref 0–37)
Alkaline Phosphatase: 46 U/L (ref 39–117)
BILIRUBIN TOTAL: 0.4 mg/dL (ref 0.3–1.2)
BUN: 15 mg/dL (ref 6–23)
CHLORIDE: 111 mmol/L (ref 96–112)
CO2: 22 mmol/L (ref 19–32)
CREATININE: 0.71 mg/dL (ref 0.50–1.10)
Calcium: 8.7 mg/dL (ref 8.4–10.5)
GFR calc Af Amer: >90 mL/min (ref >90)
GFR calc non Af Amer: >90 mL/min (ref >90)
GLUCOSE: 97 mg/dL (ref 70–99)
Potassium: 3.8 mmol/L (ref 3.5–5.1)
Sodium: 137 mmol/L (ref 135–145)
Total Protein: 7.3 g/dL (ref 6.0–8.3)

## 2014-06-14 LAB — LIPASE, BLOOD: LIPASE: 20 U/L (ref 11–59)

## 2014-06-14 LAB — I-STAT BETA HCG BLOOD, ED (MC, WL, AP ONLY)

## 2014-06-14 LAB — D-DIMER, QUANTITATIVE: D-Dimer, Quant: 0.45 ug/mL-FEU (ref 0.00–0.48)

## 2014-06-14 LAB — TROPONIN I: Troponin I: <0.03 ng/mL (ref <0.031)

## 2014-06-14 MED ORDER — HYDROMORPHONE HCL 1 MG/ML IJ SOLN
1.0000 mg | Freq: Once | INTRAMUSCULAR | Status: AC
  Administered 2014-06-14: 1 mg via INTRAVENOUS
  Filled 2014-06-14: qty 1

## 2014-06-14 MED ORDER — KETOROLAC TROMETHAMINE 30 MG/ML IJ SOLN
30.0000 mg | Freq: Once | INTRAMUSCULAR | Status: AC
  Administered 2014-06-14: 30 mg via INTRAVENOUS
  Filled 2014-06-14: qty 1

## 2014-06-14 MED ORDER — IBUPROFEN 800 MG PO TABS: 800.0000 mg | ORAL_TABLET | Freq: Three times a day (TID) | ORAL | Status: DC

## 2014-06-14 MED ORDER — SODIUM CHLORIDE 0.9 % IV SOLN
Freq: Once | INTRAVENOUS | Status: AC
  Administered 2014-06-14: 08:00:00 via INTRAVENOUS

## 2014-06-14 MED ORDER — ONDANSETRON HCL 4 MG/2ML IJ SOLN
4.0000 mg | Freq: Once | INTRAMUSCULAR | Status: AC
  Administered 2014-06-14: 4 mg via INTRAVENOUS
  Filled 2014-06-14: qty 2

## 2014-06-14 MED ORDER — HYDROCODONE-ACETAMINOPHEN 5-325 MG PO TABS: 2.0000 | ORAL_TABLET | ORAL | Status: DC | PRN

## 2014-06-14 MED ORDER — CYCLOBENZAPRINE HCL 10 MG PO TABS: 10.0000 mg | ORAL_TABLET | Freq: Three times a day (TID) | ORAL | Status: DC | PRN

## 2014-06-14 NOTE — ED Notes (Signed)
Patient reports she awakened at 0400 with sudden sharp chest pain that radiates to her back, between her shoulders.  Denies nausea/vomiting.

## 2014-06-14 NOTE — Discharge Instructions (Signed)
Chest Wall Pain °Chest wall pain is pain felt in or around the chest bones and muscles. It may take up to 6 weeks to get better. It may take longer if you are active. Chest wall pain can happen on its own. Other times, things like germs, injury, coughing, or exercise can cause the pain. °HOME CARE  °· Avoid activities that make you tired or cause pain. Try not to use your chest, belly (abdominal), or side muscles. Do not use heavy weights. °· Put ice on the sore area. °¨ Put ice in a plastic bag. °¨ Place a towel between your skin and the bag. °¨ Leave the ice on for 15-20 minutes for the first 2 days. °· Only take medicine as told by your doctor. °GET HELP RIGHT AWAY IF:  °· You have more pain or are very uncomfortable. °· You have a fever. °· Your chest pain gets worse. °· You have new problems. °· You feel sick to your stomach (nauseous) or throw up (vomit). °· You start to sweat or feel lightheaded. °· You have a cough with mucus (phlegm). °· You cough up blood. °MAKE SURE YOU:  °· Understand these instructions. °· Will watch your condition. °· Will get help right away if you are not doing well or get worse. °Document Released: 09/05/2007 Document Revised: 06/11/2011 Document Reviewed: 11/13/2010 °[employer]® Patient Information ©2015 [employer]. This information is not intended to replace advice given to you by your health care provider. Make sure you discuss any questions you have with your health care provider. °Muscle Strain °A muscle strain is an injury that occurs when a muscle is stretched beyond its normal length. Usually a small number of muscle fibers are torn when this happens. Muscle strain is rated in degrees. First-degree strains have the least amount of muscle fiber tearing and pain. Second-degree and third-degree strains have increasingly more tearing and pain.  °Usually, recovery from muscle strain takes 1-2 weeks. Complete healing takes 5-6 weeks.  °CAUSES  °Muscle strain happens when a  sudden, violent force placed on a muscle stretches it too far. This may occur with lifting, sports, or a fall.  °RISK FACTORS °Muscle strain is especially common in athletes.  °SIGNS AND SYMPTOMS °At the site of the muscle strain, there may be: °· Pain. °· Bruising. °· Swelling. °· Difficulty using the muscle due to pain or lack of normal function. °DIAGNOSIS  °Your health care provider will perform a physical exam and ask about your medical history. °TREATMENT  °Often, the best treatment for a muscle strain is resting, icing, and applying cold compresses to the injured area.   °HOME CARE INSTRUCTIONS  °· Use the PRICE method of treatment to promote muscle healing during the first 2-3 days after your injury. The PRICE method involves: °¨ Protecting the muscle from being injured again. °¨ Restricting your activity and resting the injured body part. °¨ Icing your injury. To do this, put ice in a plastic bag. Place a towel between your skin and the bag. Then, apply the ice and leave it on from 15-20 minutes each hour. After the third day, switch to moist heat packs. °¨ Apply compression to the injured area with a splint or elastic bandage. Be careful not to wrap it too tightly. This may interfere with blood circulation or increase swelling. °¨ Elevate the injured body part above the level of your heart as often as you can. °· Only take over-the-counter or prescription medicines for pain, discomfort, or fever as   directed by your health care provider.  Warming up prior to exercise helps to prevent future muscle strains. SEEK MEDICAL CARE IF:   You have increasing pain or swelling in the injured area.  You have numbness, tingling, or a significant loss of strength in the injured area. MAKE SURE YOU:   Understand these instructions.  Will watch your condition.  Will get help right away if you are not doing well or get worse. Document Released: 03/19/2005 Document Revised: 01/07/2013 Document Reviewed:  10/16/2012 [facility] Patient Information 2015 [employer], [place]. This information is not intended to replace advice given to you by your health care provider. Make sure you discuss any questions you have with your health care provider.

## 2014-06-14 NOTE — ED Provider Notes (Signed)
CSN: [id number]     Arrival date & time 06/14/14  0654 History   First MD Initiated Contact with Patient 06/14/14 0730     Chief Complaint  Patient presents with  . Chest Pain     (Consider location/radiation/quality/duration/timing/severity/associated sxs/prior Treatment) HPI Comments: Patient presents to the ER for evaluation of chest pain. Patient reports that she was awakened from sleep at 4 AM with severe, sharp, stabbing pain that is constant in her chest and between her shoulder blades. She reports that the pain worsens when she moves, such as bending or twisting. It also worsens when she takes a deep breath. This has caused her to feel short of breath. She has not had any palpitations. No cough, congestion or fever.  Patient is a 39 y.o. female presenting with chest pain.  Chest Pain Associated symptoms: back pain     Past Medical History  Diagnosis Date  . Hx of migraine headaches   . GERD (gastroesophageal reflux disease)    Past Surgical History  Procedure Laterality Date  . Breast surgery      R breast mass removed- benign   Family History  Problem Relation Age of Onset  . Cancer Mother   . Diabetes Father    History  Substance Use Topics  . Smoking status: Current Every Day Smoker -- 0.25 packs/day    Types: Cigarettes  . Smokeless tobacco: Never Used  . Alcohol Use: Yes     Comment: occasionally   OB History    No data available     Review of Systems  Cardiovascular: Positive for chest pain.  Musculoskeletal: Positive for back pain.  All other systems reviewed and are negative.     Allergies  Penicillins; Sumatriptan; and Other  Home Medications   Prior to Admission medications   Medication Sig Start Date End Date Taking? Authorizing Provider  ondansetron (ZOFRAN ODT) 4 MG disintegrating tablet Take 1 tablet (4 mg total) by mouth every 8 (eight) hours as needed for nausea or vomiting. Patient not taking: Reported on 03/09/2014 04/07/13   Renne Crigler, PA-C   BP 97/65 mmHg  Pulse 60  Temp(Src) 98.8 F (37.1 C) (Oral)  Resp 16  Ht  (1.626 m)  Wt 150 lb (68.04 kg)  BMI 25.73 kg/m2  SpO2 99%  LMP 05/07/2014 Physical Exam  Constitutional: She is oriented to person, place, and time. She appears well-developed and well-nourished. No distress.  HENT:  Head: Normocephalic and atraumatic.  Right Ear: Hearing normal.  Left Ear: Hearing normal.  Nose: Nose normal.  Mouth/Throat: Oropharynx is clear and moist and mucous membranes are normal.  Eyes: Conjunctivae and EOM are normal. Pupils are equal, round, and reactive to light.  Neck: Normal range of motion. Neck supple.  Cardiovascular: Regular rhythm, S1 normal and S2 normal.  Exam reveals no gallop and no friction rub.   No murmur heard. Pulmonary/Chest: Effort normal and breath sounds normal. No respiratory distress. She exhibits no tenderness.  Abdominal: Soft. Normal appearance and bowel sounds are normal. There is no hepatosplenomegaly. There is no tenderness. There is no rebound, no guarding, no tenderness at McBurney's point and negative Murphy's sign. No hernia.  Musculoskeletal: Normal range of motion.       Thoracic back: She exhibits tenderness.       Back:  Pain with palpation and movement of the torso  Neurological: She is alert and oriented to person, place, and time. She has normal strength. No cranial nerve deficit or  sensory deficit. Coordination normal. GCS eye subscore is 4. GCS verbal subscore is 5. GCS motor subscore is 6.  Skin: Skin is warm, dry and intact. No rash noted. No cyanosis.  Psychiatric: She has a normal mood and affect. Her speech is normal and behavior is normal. Thought content normal.  Nursing note and vitals reviewed.   ED Course  Procedures (including critical care time) Labs Review Labs Reviewed  COMPREHENSIVE METABOLIC PANEL - Abnormal; Notable for the following:    Anion gap 4 (*)    All other components within normal limits    URINALYSIS, ROUTINE W REFLEX MICROSCOPIC - Abnormal; Notable for the following:    Hgb urine dipstick TRACE (*)    All other components within normal limits  URINE MICROSCOPIC-ADD ON - Abnormal; Notable for the following:    Casts HYALINE CASTS (*)    All other components within normal limits  CBC WITH DIFFERENTIAL/PLATELET  LIPASE, BLOOD  TROPONIN I  D-DIMER, QUANTITATIVE  I-STAT BETA HCG BLOOD, ED ([facility], [facility], [facility] ONLY)    Imaging Review Dg Chest 2 View  06/14/2014   CLINICAL DATA:  Acute chest pain today.  EXAM: CHEST  2 VIEW  COMPARISON:  None.  FINDINGS: The heart size and mediastinal contours are within normal limits. Both lungs are clear. No pneumothorax or pleural effusion is noted. The visualized skeletal structures are unremarkable.  IMPRESSION: No acute cardiopulmonary abnormality seen.   Electronically Signed   By: Lupita RaiderJames  Green Jr, M.D.   On: 06/14/2014 09:17     EKG Interpretation   Date/Time:  Monday June 14 2014 07:22:45 EDT Ventricular Rate:  66 PR Interval:  155 QRS Duration: 85 QT Interval:  390 QTC Calculation: 409 R Axis:   -9 Text Interpretation:  Sinus rhythm Low voltage, precordial leads Poor R  wave progression No previous tracing Confirmed by POLLINA  MD, CHRISTOPHER  ([id number]) on 06/14/2014 7:48:53 AM      MDM   Final diagnoses:  Chest pain    Patient presents to the ER for evaluation of chest and back pain. Symptoms began early this morning. She reports being awakened from sleep with a sharp pain in her back and chest. Pain continues here in the ER. Pain is very atypical for cardiac etiology. She is tender to palpation and has significant worsening of her pain with range of motion of her torso. This is consistent with musculoskeletal pain. Her workup is entirely unremarkable. EKG, troponin, d-dimer negative. Patient not hypoxic or tachycardic. She was treated with analgesia and improved. Patient reassured, will be discharged with continued treatment for  musculoskeletal pain.    Gilda Creasehristopher J Pollina, MD 06/14/14 [phone]

## 2015-02-13 ENCOUNTER — Emergency Department (HOSPITAL_COMMUNITY)
Admission: EM | Admit: 2015-02-13 | Discharge: 2015-02-13 | Disposition: A | Discharge status: Home / Self Care | Payer: Self-pay | Attending: Emergency Medicine | Admitting: Emergency Medicine

## 2015-02-13 ENCOUNTER — Encounter (HOSPITAL_COMMUNITY): Payer: Self-pay | Admitting: Emergency Medicine

## 2015-02-13 ENCOUNTER — Emergency Department (HOSPITAL_COMMUNITY): Payer: Medicaid Other

## 2015-02-13 DIAGNOSIS — M545 Low back pain: Secondary | ICD-10-CM | POA: Insufficient documentation

## 2015-02-13 DIAGNOSIS — G43909 Migraine, unspecified, not intractable, without status migrainosus: Secondary | ICD-10-CM | POA: Insufficient documentation

## 2015-02-13 DIAGNOSIS — Z8719 Personal history of other diseases of the digestive system: Secondary | ICD-10-CM | POA: Insufficient documentation

## 2015-02-13 DIAGNOSIS — Z79899 Other long term (current) drug therapy: Secondary | ICD-10-CM | POA: Insufficient documentation

## 2015-02-13 DIAGNOSIS — J159 Unspecified bacterial pneumonia: Secondary | ICD-10-CM | POA: Insufficient documentation

## 2015-02-13 DIAGNOSIS — J189 Pneumonia, unspecified organism: Secondary | ICD-10-CM

## 2015-02-13 DIAGNOSIS — F17210 Nicotine dependence, cigarettes, uncomplicated: Secondary | ICD-10-CM | POA: Insufficient documentation

## 2015-02-13 DIAGNOSIS — Z880 Allergy status to penicillin: Secondary | ICD-10-CM | POA: Insufficient documentation

## 2015-02-13 LAB — BASIC METABOLIC PANEL
Anion gap: 7 (ref 5–15)
BUN: 13 mg/dL (ref 6–20)
CO2: 26 mmol/L (ref 22–32)
CREATININE: 0.76 mg/dL (ref 0.44–1.00)
Calcium: 9.5 mg/dL (ref 8.9–10.3)
Chloride: 108 mmol/L (ref 101–111)
GFR calc Af Amer: >60 mL/min (ref >60)
GFR calc non Af Amer: >60 mL/min (ref >60)
Glucose, Bld: 89 mg/dL (ref 65–99)
Potassium: 3.6 mmol/L (ref 3.5–5.1)
Sodium: 141 mmol/L (ref 135–145)

## 2015-02-13 LAB — CBC WITH DIFFERENTIAL/PLATELET
Basophils Absolute: 0 10*3/uL (ref 0.0–0.1)
Basophils Relative: 0 %
Eosinophils Absolute: 0.1 10*3/uL (ref 0.0–0.7)
Eosinophils Relative: 1 %
HEMATOCRIT: 39.6 % (ref 36.0–46.0)
Hemoglobin: 13.2 g/dL (ref 12.0–15.0)
LYMPHS ABS: 2.7 10*3/uL (ref 0.7–4.0)
Lymphocytes Relative: 39 %
MCH: 31.5 pg (ref 26.0–34.0)
MCHC: 33.3 g/dL (ref 30.0–36.0)
MCV: 94.5 fL (ref 78.0–100.0)
MONO ABS: 0.6 10*3/uL (ref 0.1–1.0)
Monocytes Relative: 9 %
NEUTROS ABS: 3.5 10*3/uL (ref 1.7–7.7)
NEUTROS PCT: 51 %
Platelets: 386 10*3/uL (ref 150–400)
RBC: 4.19 MIL/uL (ref 3.87–5.11)
RDW: 14.1 % (ref 11.5–15.5)
WBC: 7 10*3/uL (ref 4.0–10.5)

## 2015-02-13 LAB — I-STAT TROPONIN, ED: Troponin i, poc: 0 ng/mL (ref 0.00–0.08)

## 2015-02-13 MED ORDER — AZITHROMYCIN 250 MG PO TABS
500.0000 mg | ORAL_TABLET | Freq: Once | ORAL | Status: AC
  Administered 2015-02-13: 500 mg via ORAL
  Filled 2015-02-13: qty 2

## 2015-02-13 MED ORDER — KETOROLAC TROMETHAMINE 30 MG/ML IJ SOLN
30.0000 mg | Freq: Once | INTRAMUSCULAR | Status: AC
  Administered 2015-02-13: 30 mg via INTRAVENOUS
  Filled 2015-02-13: qty 1

## 2015-02-13 MED ORDER — AZITHROMYCIN 250 MG PO TABS
250.0000 mg | ORAL_TABLET | Freq: Every day | ORAL | Status: DC
Start: 2015-02-13 — End: 2019-12-10

## 2015-02-13 NOTE — ED Notes (Signed)
Pt c/o CP radiating to back w/ SOB.  Pt not wanting to answer questions d/t pain.

## 2015-02-13 NOTE — Discharge Instructions (Signed)
Community-Acquired Pneumonia, Adult °Pneumonia is an infection of the lungs. One type of pneumonia can happen while a person is in a hospital. A different type can happen when a person is not in a hospital (community-acquired pneumonia). It is easy for this kind to spread from person to person. It can spread to you if you breathe near an infected person who coughs or sneezes. Some symptoms include: °· A dry cough. °· A wet (productive) cough. °· Fever. °· Sweating. °· Chest pain. °HOME CARE °· Take over-the-counter and prescription medicines only as told by your doctor. °¨ Only take cough medicine if you are losing sleep. °¨ If you were prescribed an antibiotic medicine, take it as told by your doctor. Do not stop taking the antibiotic even if you start to feel better. °· Sleep with your head and neck raised (elevated). You can do this by putting a few pillows under your head, or you can sleep in a recliner. °· Do not use tobacco products. These include cigarettes, chewing tobacco, and e-cigarettes. If you need help quitting, ask your doctor. °· Drink enough water to keep your pee (urine) clear or pale yellow. °A shot (vaccine) can help prevent pneumonia. Shots are often suggested for: °· People older than 37 years of age. °· People older than 37 years of age: °¨ Who are having cancer treatment. °¨ Who have long-term (chronic) lung disease. °¨ Who have problems with their body's defense system (immune system). °You may also prevent pneumonia if you take these actions: °· Get the flu (influenza) shot every year. °· Go to the dentist as often as told. °· Wash your hands often. If soap and water are not available, use hand sanitizer. °GET HELP IF: °· You have a fever. °· You lose sleep because your cough medicine does not help. °GET HELP RIGHT AWAY IF: °· You are short of breath and it gets worse. °· You have more chest pain. °· Your sickness gets worse. This is very serious if: °¨ You are an older adult. °¨ Your  body's defense system is weak. °· You cough up blood. °  °This information is not intended to replace advice given to you by your health care provider. Make sure you discuss any questions you have with your health care provider. °  °Document Released: 09/05/2007 Document Revised: 12/08/2014 Document Reviewed: 07/14/2014 °Elsevier Interactive Patient Education ©2016 Elsevier Inc. ° °Emergency Department Resource Guide °1) Find a Doctor and Pay Out of Pocket °Although you won't have to find out who is covered by your insurance plan, it is a good idea to ask around and get recommendations. You will then need to call the office and see if the doctor you have chosen will accept you as a new patient and what types of options they offer for patients who are self-pay. Some doctors offer discounts or will set up payment plans for their patients who do not have insurance, but you will need to ask so you aren't surprised when you get to your appointment. ° °2) Contact Your Local Health Department °Not all health departments have doctors that can see patients for sick visits, but many do, so it is worth a call to see if yours does. If you don't know where your local health department is, you can check in your phone book. The CDC also has a tool to help you locate your state's health department, and many state websites also have listings of all of their local health departments. ° °3)   Find a Walk-in Clinic °If your illness is not likely to be very severe or complicated, you may want to try a walk in clinic. These are popping up all over the country in pharmacies, drugstores, and shopping centers. They're usually staffed by nurse practitioners or physician assistants that have been trained to treat common illnesses and complaints. They're usually fairly [place] and inexpensive. However, if you have serious medical issues or chronic medical problems, these are probably not your best option. ° °No Primary Care Doctor: °- Call Health  Connect at  [phone] - they can help you locate a primary care doctor that  accepts your insurance, provides certain services, etc. °- Physician Referral Service- 1-800-533-3463 ° °Chronic Pain Problems: °Organization         Address  Phone   Notes  °[facility]  (336) 297-2271 Patients need to be referred by their primary care doctor.  ° °Medication Assistance: °Organization         Address  Phone   Notes  °[place] [facility] [address] °[place], [place] [address] (336) 641-8030 --Must be a resident of [place] °-- Must have NO insurance coverage whatsoever (no Medicaid/ Medicare, etc.) °-- The pt. MUST have a primary care doctor that directs their care regularly and follows them in the community °  °[facility]  (866) 331-1348   °[employer]  (888) 892-1162   ° °Agencies that provide inexpensive medical care: °Organization         Address  Phone   Notes  °[facility]  (336) 832-8035   °[facility]    (336) 832-7272   °[facility] [address] °[place], [place] [address] (336) 832-4777   °[facility] [address], °[place] (336) 271-4999   °[facility]    (336) 373-0678   °[facility]    (336) 272-1050   °[facility] ° [address], [place] Phone:  (336) 832-4444, Fax:  (336) 832-4440 Hours of Operation:  9 am - 6 pm, M-F.  Also accepts Medicaid/Medicare and self-pay.  °[facility] ° [address], [place] Phone: (336) 832-3150, Fax: (336) [phone]. Hours of Operation:  8:30 am - 5:30 pm, M-F.  Also accepts Medicaid and self-pay.  °[facility] [place] [address], [place] Phone: (336) 878-6027   °[facility] [address], [place], [place] (336)723-1848, Ext. 123 Mondays & Thursdays: 7-9 AM.  First 15 patients are seen on a first come, first serve basis. °   ° °Medicaid-accepting [place] Providers: ° °Organization         Address  Phone   Notes  °[facility] [address], [place] (336) 641-2100 Also accepts self-pay patients.  °[facility] [address], [place] ° (336) 856-9996   °[facility] [address] [place] [address], [place] (336) 288-8857   °[facility] [address] [place] [address], [place] (336) 299-7000   °Veita Bland [address], [place]  ° (336) 373-1557 Only accepts [place] Access Medicaid patients after they have their name applied to their card.  ° °Self-Pay (no insurance) in [place]: ° °Organization         Address  Phone   Notes  °Sickle Cell Patients, [facility] [address], [place] (336) 832-1970   °[facility] [address], [place] (336) 832-4400   °[facility] ° [address], [place] (336) 992-4800   °[facility]/Dr. Osei-Bonsu ° [address] [place] [address], [place] or [address], [place] (336) [phone] Phone number for both [place] and [place] locations is the same.  °[facility] [address], [place] (336) 299-0000   °[facility] [address] [place] [address], [place] or [address] (336) 852-7530 °(336) 878-2260   °[facility] [address], [place] (336) 350-1642, phone; (336) 294-5005, fax Sees patients 1st and 3rd Saturday of every month.  Must not qualify for public or private insurance (i.e. Medicaid, Medicare, [place] Health Choice, Veterans' Benefits) • Household income should be no more than 200% of the poverty level •The clinic cannot treat you if you are pregnant or think you are pregnant • Sexually transmitted diseases are not treated at the clinic.  ° ° °Dental Care: °Organization         Address  Phone  Notes  °[place]  [facility] [address], [place] (336) 641-6152 Accepts children up to age 21 who are enrolled in Medicaid or [place] Health Choice; pregnant women with a Medicaid card; and children who have applied for Medicaid or [place] Health Choice, but were declined, whose parents can pay a reduced fee at time of service.  °[place] [place]  [address], [place] (336) 641-7733 Accepts children up to age 21 who are enrolled in Medicaid or [place] Health Choice; pregnant women with a Medicaid card; and children who have applied for Medicaid or [place] Health Choice, but were declined, whose parents can pay a reduced fee at time of service.  °[facility] ° [address], [place] (336) 641-4533 Patients are seen by appointment only. Walk-ins are not accepted. [facility] will see patients 18 years of age and older. °Monday - Tuesday (8am-5pm) °Most Wednesdays (8:30-5pm) °$30 per visit, cash only  °[facility] ° [address], [place] (336) 641-4533 Patients are seen by appointment only. Walk-ins are not accepted. [facility] will see patients 18 years of age and older. °One Wednesday Evening (Monthly: Volunteer Based).  $30 per visit, cash only  °[facility]  (919) 537-3737 for adults; Children under age 4, call Graduate Pediatric Dentistry at ([phone]) [phone]. Children aged 4-14, please call (919) [phone] to request a pediatric application. ° Dental services are provided in all areas of dental care including fillings, crowns and bridges, complete and partial dentures, implants, gum treatment, root canals, and extractions. Preventive care is also provided. Treatment is provided to both adults and children. °Patients are selected via a lottery and there is often a waiting list. °  °[facility] [address], °[place] ° (336) 763-8833  [email or web address] °  °[facility] [address], [place], [place] (336)723-1848, Ext. 123 Second and Fourth Thursday of each month, opens at 6:30 AM; Clinic ends at 9 AM.  Patients are seen on a first-come first-served basis, and a limited number are seen during each clinic.  ° °[facility] ° [address], [place],  (336) 723-7904   Eligibility Requirements °You must have lived in [place], [place],   or [place] counties for at least the last three months. °  You cannot be eligible for state or federal sponsored healthcare insurance, including Veterans Administration, Medicaid, or Medicare. °  You generally cannot be eligible for healthcare insurance through your employer.  °  How to apply: °Eligibility screenings are held every Tuesday and Wednesday afternoon from 1:00 pm until 4:00 pm. You do not need an appointment for the interview!  °[facility] [address], [place], [place] 336-631-2330   °[facility]  336-342-8273   °[facility]  336-703-3100   °[facility]  336-570-6415   ° °Behavioral Health Resources in the Community: °Intensive Outpatient Programs °Organization         Address  Phone  Notes  °[facility] [address], [place], [place] 336-878-6098   °[facility] [address], [place], [place] 336-832-9800   °[facility] [address], [place], [place] ° 336-882-2125   °[facility] [address],  °[place], [place] 1-800-853-5163 or 336-641-4981   °Substance Abuse Resources °Organization         Address  Phone  Notes  °[facility]  336-882-2125   °[facility]  336-784-9470   °[facility]  336-285-9073   °[facility]  336-845-3988   °Residential & Outpatient Substance Abuse Program  1-800-659-3381   °Psychological Services °Organization          Address  Phone  Notes  °[facility]  336- 832-9600   °[facility]  336- 378-7881   °[facility] [address], [place] 1-800-853-5163 or 336-641-4981   ° °Mobile Crisis Teams °Organization         Address  Phone  Notes  °[facility], Mobile Crisis Care Unit  1-877-626-1772   °Assertive °[facility] ° [address] [place], [place] 336-834-9664   °Sharon DeEsch [address] °[place] 336-554-5454   ° °Self-Help/Support Groups °Organization         Address  Phone             Notes  °[facility] [place] - variety of support groups  336- 373-1402 Call for more information  °Narcotics Anonymous (NA), [facility] [address], °[place]  2 meetings at this location  ° °Residential Treatment Programs °Organization         Address  Phone  Notes  °[facility] [address],    °[place]  1-866-801-8205   °[facility] ° [address], [place], [place] 704-293-8524   °[facility] [address], [place] 336-845-3988 Admissions: 8am-3pm M-F  °[facility] [address]    °[place], [place] 336-841-1104   °[facility] [address], [place], [place] 336-379-7146   °[facility] [address]  °[place], [place] 336-285-9073   °[facility] Intensive Outpatient [address], [place], [place] 336-852-3033   °[facility] [address]  °[place], [place] 1-877-615-2722 or 336-784-9470   °[facility] [address] [place], [place] 336-227-7417 Accepts Medicaid  °[facility] [address]  °  1-800-659-3381 Substance Abuse/Addiction Treatment  ° °[place] Behavioral Health Resources °Organization         Address  Phone  Notes  °[facility]  (888) 581-9988   °Julie Brannon, PhD [address] [place], [place]   (336) 349-5553 or (336) 951-0000   °[facility]   [address] °[place], [place] (336) 349-4454   °[facility] [address], [place], [place] (336) 342-8316 Insurance/Medicaid/sponsorship through [facility]  °[facility] [address]                                    [place], [place] (336) 342-8316 Therapy/tele-psych/case  °[facility] [address]  ° [place], [place] (336) 349-2233    °Dr. Arfeen  (336) 349-4544   °[facility] [place]  [employer] [place] [facility] 1) [address],  °2) [address], [place] °3)  [address], [place] (336) 349-3220 °(336) 342-7768 ° °(336) 342-8140   °[place] [facility] (336) 342-1394 or (336) 342-3537 (After Hours)    ° ° ° °

## 2015-02-13 NOTE — ED Notes (Signed)
Lab delay due to pt being in xray

## 2015-02-13 NOTE — ED Notes (Signed)
Pt reports improvement in pain (now 5/10) as well as SOB after medication administration.  NAD

## 2015-02-13 NOTE — ED Provider Notes (Signed)
CSN: [id number]     Arrival date & time 02/13/15  1314 History   First MD Initiated Contact with Patient 02/13/15 1502     Chief Complaint  Patient presents with  . Back Pain  . Shortness of Breath   (Consider location/radiation/quality/duration/timing/severity/associated sxs/prior Treatment) Patient is a 39 y.o. female presenting with back pain and shortness of breath. The history is provided by the patient. No language interpreter was used.  Back Pain Associated symptoms: chest pain   Associated symptoms: no abdominal pain and no fever   Shortness of Breath Associated symptoms: chest pain   Associated symptoms: no abdominal pain, no cough, no fever, no vomiting and no wheezing   Ms. Manson PasseyBrown is a 39 year old female with no significant past medical history who presents for a stabbing lower diaphragm pain that wraps around her back on bilaterally that began gradually yesterday. She states when she lies flat she feels short of breath. She tried icy hot with little relief. She denies having these symptoms in the past. She denies any injury or fall. She denies radiation of pain to neck or arm. She smokes half a pack per day. She denies being on birth control. She denies any fever, recent cough, abdominal pain, nausea, vomiting, diarrhea. She is currently on metronidazole for bacterial vaginosis.   Past Medical History  Diagnosis Date  . Hx of migraine headaches   . GERD (gastroesophageal reflux disease)    Past Surgical History  Procedure Laterality Date  . Breast surgery      R breast mass removed- benign   Family History  Problem Relation Age of Onset  . Cancer Mother   . Diabetes Father    Social History  Substance Use Topics  . Smoking status: Current Every Day Smoker -- 0.25 packs/day    Types: Cigarettes  . Smokeless tobacco: Never Used  . Alcohol Use: Yes     Comment: occasionally   OB History    No data available     Review of Systems  Constitutional: Negative for  fever.  Respiratory: Positive for shortness of breath. Negative for cough and wheezing.   Cardiovascular: Positive for chest pain.  Gastrointestinal: Negative for nausea, vomiting and abdominal pain.  Musculoskeletal: Positive for back pain.  All other systems reviewed and are negative.     Allergies  Penicillins; Sumatriptan; and Other  Home Medications   Prior to Admission medications   Medication Sig Start Date End Date Taking? Authorizing Provider  metroNIDAZOLE (FLAGYL) 500 MG tablet Take 500 mg by mouth 2 (two) times daily. 02/10/15 02/17/15 Yes Historical Provider, MD  Prenatal Vit-Fe Fumarate-FA (PRENATAL VITAMIN) 27-0.8 MG TABS Take 1 tablet by mouth daily.   Yes Historical Provider, MD  azithromycin (ZITHROMAX) 250 MG tablet Take 1 tablet (250 mg total) by mouth daily. Take 1 every day until finished beginning on Monday 11/14.  You received your first dose in the Emergency Department. 02/13/15   Farmer Mccahill Patel-Mills, PA-C  cyclobenzaprine (FLEXERIL) 10 MG tablet Take 1 tablet (10 mg total) by mouth 3 (three) times daily as needed for muscle spasms. Patient not taking: Reported on 02/13/2015 06/14/14   Gilda Creasehristopher J Pollina, MD  HYDROcodone-acetaminophen (NORCO/VICODIN) 5-325 MG per tablet Take 2 tablets by mouth every 4 (four) hours as needed for moderate pain. Patient not taking: Reported on 02/13/2015 06/14/14   Gilda Creasehristopher J Pollina, MD  ibuprofen (ADVIL,MOTRIN) 800 MG tablet Take 1 tablet (800 mg total) by mouth 3 (three) times daily. Patient not taking: Reported on  02/13/2015 06/14/14   Gilda Crease, MD  ondansetron (ZOFRAN ODT) 4 MG disintegrating tablet Take 1 tablet (4 mg total) by mouth every 8 (eight) hours as needed for nausea or vomiting. Patient not taking: Reported on 03/09/2014 04/07/13   Renne Crigler, PA-C   BP 100/66 mmHg  Pulse 57  Temp(Src) 97.9 F (36.6 C) (Oral)  Resp 22  SpO2 94%  LMP 01/28/2015 Physical Exam  Constitutional: She is oriented to  person, place, and time. She appears well-developed and well-nourished.  HENT:  Head: Normocephalic and atraumatic.  Eyes: Conjunctivae are normal.  Neck: Normal range of motion. Neck supple.  Cardiovascular: Normal rate, regular rhythm and normal heart sounds.   Regular rate and rhythm. No murmurs.  Pulmonary/Chest: Effort normal and breath sounds normal. No respiratory distress. She has no wheezes. She has no rales.  Lungs are clear to auscultation bilaterally. No decreased breath sounds or wheezing. No reproducible chest wall pain. No rash.  Abdominal: Soft. There is no tenderness.  No abdominal tenderness to palpation.  Musculoskeletal: Normal range of motion.  Neurological: She is alert and oriented to person, place, and time.  Skin: Skin is warm and dry.  Nursing note and vitals reviewed.   ED Course  Procedures (including critical care time) Labs Review Labs Reviewed  CBC WITH DIFFERENTIAL/PLATELET  BASIC METABOLIC PANEL  Rosezena Sensor, ED    Imaging Review Dg Chest 2 View  02/13/2015  CLINICAL DATA:  Shortness of breath and chest pain 2 days. EXAM: CHEST  2 VIEW COMPARISON:  06/14/2014 FINDINGS: Lungs are adequately inflated with subtle left basilar opacification likely atelectasis although cannot exclude early infection. No evidence of effusion. Cardiomediastinal silhouette and remainder of the exam are within normal. IMPRESSION: Subtle left basilar opacification likely atelectasis although cannot exclude early infection. Electronically Signed   By: Elberta Fortis M.D.   On: 02/13/2015 15:37   I have personally reviewed and evaluated these images and lab results as part of my medical decision-making.   EKG Interpretation   Date/Time:  Sunday February 13 2015 13:18:16 EST Ventricular Rate:  72 PR Interval:  153 QRS Duration: 88 QT Interval:  388 QTC Calculation: 425 R Axis:   -39 Text Interpretation:  Sinus rhythm Left axis deviation Low voltage,  precordial  leads Anteroseptal infarct, old Baseline wander in lead(s) II  aVR aVF V2 Reconfirmed by GOLDSTON  MD, SCOTT ([id number]) on 02/13/2015 3:05:19  PM      MDM   Final diagnoses:  Community acquired pneumonia   Patient presents for lower diaphragm pain radiating bilaterally to her back. She also complains of some shortness of breath when laying flat. She is well-appearing and in no acute distress. She is afebrile with respiratory rate of 22 and oxygen at 100%. I do not believe this is acute coronary syndrome or heart related. Her heart score is very low. I do not suspect PE since she is PERC negative. Her EKG is not concerning. Her labs are unremarkable, including troponin. Her chest x-ray shows subtle left basilar opacification that is likely atelectasis but early infection cannot be ruled out.  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  She is young and otherwise healthy with good air movement and no respiratory distress.  I will treat with azithromycin and have her follow up with her pcp within 48 hours. Patient verbalizes understanding of plan.    Catha Gosselin, PA-C 02/13/15 1926  Pricilla Loveless, MD 02/16/15 [phone]

## 2016-02-01 ENCOUNTER — Emergency Department (HOSPITAL_COMMUNITY)
Admission: EM | Admit: 2016-02-01 | Discharge: 2016-02-01 | Disposition: A | Discharge status: Home / Self Care | Payer: Medicaid Other | Attending: Emergency Medicine | Admitting: Emergency Medicine

## 2016-02-01 ENCOUNTER — Encounter (HOSPITAL_COMMUNITY): Payer: Self-pay | Admitting: Emergency Medicine

## 2016-02-01 DIAGNOSIS — Z792 Long term (current) use of antibiotics: Secondary | ICD-10-CM | POA: Insufficient documentation

## 2016-02-01 DIAGNOSIS — G43109 Migraine with aura, not intractable, without status migrainosus: Secondary | ICD-10-CM

## 2016-02-01 DIAGNOSIS — Z87891 Personal history of nicotine dependence: Secondary | ICD-10-CM | POA: Insufficient documentation

## 2016-02-01 DIAGNOSIS — G43809 Other migraine, not intractable, without status migrainosus: Secondary | ICD-10-CM | POA: Insufficient documentation

## 2016-02-01 MED ORDER — METOCLOPRAMIDE HCL 10 MG PO TABS: 10.0000 mg | ORAL_TABLET | Freq: Four times a day (QID) | ORAL | 0 refills | Status: AC | PRN

## 2016-02-01 MED ORDER — METOCLOPRAMIDE HCL 5 MG/ML IJ SOLN
10.0000 mg | Freq: Once | INTRAMUSCULAR | Status: AC
  Administered 2016-02-01: 10 mg via INTRAVENOUS
  Filled 2016-02-01: qty 2

## 2016-02-01 MED ORDER — DEXAMETHASONE SODIUM PHOSPHATE 10 MG/ML IJ SOLN
10.0000 mg | Freq: Once | INTRAMUSCULAR | Status: AC
  Administered 2016-02-01: 10 mg via INTRAVENOUS
  Filled 2016-02-01: qty 1

## 2016-02-01 MED ORDER — KETOROLAC TROMETHAMINE 30 MG/ML IJ SOLN
30.0000 mg | Freq: Once | INTRAMUSCULAR | Status: AC
  Administered 2016-02-01: 30 mg via INTRAVENOUS
  Filled 2016-02-01: qty 1

## 2016-02-01 MED ORDER — SODIUM CHLORIDE 0.9 % IV BOLUS (SEPSIS)
1000.0000 mL | Freq: Once | INTRAVENOUS | Status: AC
  Administered 2016-02-01: 1000 mL via INTRAVENOUS

## 2016-02-01 MED ORDER — DIPHENHYDRAMINE HCL 50 MG/ML IJ SOLN
25.0000 mg | Freq: Once | INTRAMUSCULAR | Status: AC
  Administered 2016-02-01: 25 mg via INTRAVENOUS
  Filled 2016-02-01: qty 1

## 2016-02-01 NOTE — ED Provider Notes (Signed)
[facility]-EMERGENCY DEPT Provider Note   CSN: [id number] Arrival date & time: 02/01/16  1449     History   Chief Complaint Chief Complaint  Patient presents with  . Headache    HPI Kristin Huynh is a 40 y.o. female.  She comes in with a two day history of a retro-orbital headache with associated nausea and photophobia. Pain is sharp and throbbing. She rates it at 10/10. She has taken Excedrin with slight, temporary relief. Today, she noticed numbness of her right arm which waxes and wanes. There has been no weakness, no speech difficulty, no gait disturbance. Headache is similar to past migraines, but she has not had numbness before. She has also noted feeling jittery, and has had some diaphoresis.   The history is provided by the patient.    Past Medical History:  Diagnosis Date  . GERD (gastroesophageal reflux disease)   . Hx of migraine headaches     There are no active problems to display for this patient.   Past Surgical History:  Procedure Laterality Date  . BREAST SURGERY     R breast mass removed- benign    OB History    No data available       Home Medications    Prior to Admission medications   Medication Sig Start Date End Date Taking? Authorizing Provider  azithromycin (ZITHROMAX) 250 MG tablet Take 1 tablet (250 mg total) by mouth daily. Take 1 every day until finished beginning on Monday 11/14.  You received your first dose in the Emergency Department. 02/13/15   Hanna Patel-Mills, PA-C  cyclobenzaprine (FLEXERIL) 10 MG tablet Take 1 tablet (10 mg total) by mouth 3 (three) times daily as needed for muscle spasms. Patient not taking: Reported on 02/13/2015 06/14/14   Gilda Creasehristopher J Pollina, MD  HYDROcodone-acetaminophen (NORCO/VICODIN) 5-325 MG per tablet Take 2 tablets by mouth every 4 (four) hours as needed for moderate pain. Patient not taking: Reported on 02/13/2015 06/14/14   Gilda Creasehristopher J Pollina, MD  ibuprofen (ADVIL,MOTRIN) 800 MG tablet Take 1 tablet  (800 mg total) by mouth 3 (three) times daily. Patient not taking: Reported on 02/13/2015 06/14/14   Gilda Creasehristopher J Pollina, MD  ondansetron (ZOFRAN ODT) 4 MG disintegrating tablet Take 1 tablet (4 mg total) by mouth every 8 (eight) hours as needed for nausea or vomiting. Patient not taking: Reported on 03/09/2014 04/07/13   Renne CriglerJoshua Geiple, PA-C  Prenatal Vit-Fe Fumarate-FA (PRENATAL VITAMIN) 27-0.8 MG TABS Take 1 tablet by mouth daily.    Historical Provider, MD    Family History Family History  Problem Relation Age of Onset  . Cancer Mother   . Diabetes Father     Social History Social History  Substance Use Topics  . Smoking status: Former Smoker    Packs/day: 0.25    Types: Cigarettes  . Smokeless tobacco: Never Used  . Alcohol use Yes     Comment: occasionally     Allergies   Penicillins; Sumatriptan; and Other   Review of Systems Review of Systems  All other systems reviewed and are negative.    Physical Exam Updated Vital Signs BP 150/93 (BP Location: Left Arm)   Pulse 86   Temp 97.9 F (36.6 C) (Oral)   Resp 18   Ht 5\' 3"  (1.6 m)   Wt 145 lb (65.8 kg)   LMP 01/28/2016   SpO2 100%   BMI 25.69 kg/m   Physical Exam  Nursing note and vitals reviewed.  40 year old female, resting  comfortably and in no acute distress. Vital signs are significant for hypertension. Oxygen saturation is 100%, which is normal. Head is normocephalic and atraumatic. PERRLA, EOMI. Oropharynx is clear. Moderate photophobia present. Neck is nontender and supple without adenopathy or JVD. Back is nontender and there is no CVA tenderness. Lungs are clear without rales, wheezes, or rhonchi. Chest is nontender. Heart has regular rate and rhythm without murmur. Abdomen is soft, flat, nontender without masses or hepatosplenomegaly and peristalsis is normoactive. Extremities have no cyanosis or edema, full range of motion is present. Skin is warm and dry without rash. Neurologic: Mental  status is normal, cranial nerves are intact, there are no motor or sensory deficits. No pronator drift. No extinction on double-simultaneous stimulation.  ED Treatments / Results   Procedures Procedures (including critical care time)  Medications Ordered in ED Medications  sodium chloride 0.9 % bolus 1,000 mL (not administered)  metoCLOPramide (REGLAN) injection 10 mg (not administered)  ketorolac (TORADOL) 30 MG/ML injection 30 mg (not administered)  diphenhydrAMINE (BENADRYL) injection 25 mg (not administered)  dexamethasone (DECADRON) injection 10 mg (not administered)     Initial Impression / Assessment and Plan / ED Course  I have reviewed the triage vital signs and the nursing notes.  Pertinent labs & imaging results that were available during my care of the patient were reviewed by me and considered in my medical decision making (see chart for details).  Clinical Course   Headache with pattern strongly suggestive of migraine. Subjective numbness without actual sensory loss. Doubt stroke or TIA. Old records are reviewed, and she has prior ED visits for migraine. Will treat with a migraine cocktail of normal saline, metoclopramide, diphenhydramine, ketorolac and dexamethasone. Will hold off on imaging to assess response to migraine cocktail.  4:38 PM She had complete relief of headache and complete resolution of numbness with above noted treatment. She is discharged with a prescription for metoclopramide.  Final Clinical Impressions(s) / ED Diagnoses   Final diagnoses:  Complicated migraine    New Prescriptions New Prescriptions   METOCLOPRAMIDE (REGLAN) 10 MG TABLET    Take 1 tablet (10 mg total) by mouth every 6 (six) hours as needed for nausea (or headache).     Dione Boozeavid Tyresa Prindiville, MD 02/01/16 [phone]

## 2016-02-01 NOTE — ED Triage Notes (Signed)
Patient reports headache x2 days. Reports nausea/vomiting/photo-sensitivity.  Patient also reports some intermittent right sided numbness. Patient took Excedrin tonight and today with some relief of pain. Hx of migraine. Patient speaking in full sentences, alert, and oriented x4. Patient able to move all extremities on command and follow commands.

## 2016-02-01 NOTE — ED Notes (Signed)
Signature pad in room is not working

## 2019-07-13 ENCOUNTER — Ambulatory Visit: Payer: Medicaid Other | Attending: Internal Medicine

## 2019-12-10 ENCOUNTER — Ambulatory Visit (HOSPITAL_COMMUNITY)
Admission: EM | Admit: 2019-12-10 | Discharge: 2019-12-10 | Disposition: A | Discharge status: Home / Self Care | Payer: 59 | Attending: Emergency Medicine | Admitting: Emergency Medicine

## 2019-12-10 ENCOUNTER — Ambulatory Visit (INDEPENDENT_AMBULATORY_CARE_PROVIDER_SITE_OTHER): Payer: 59

## 2019-12-10 ENCOUNTER — Other Ambulatory Visit: Payer: Self-pay

## 2019-12-10 ENCOUNTER — Encounter (HOSPITAL_COMMUNITY): Payer: Self-pay | Admitting: Emergency Medicine

## 2019-12-10 DIAGNOSIS — B349 Viral infection, unspecified: Secondary | ICD-10-CM | POA: Diagnosis present

## 2019-12-10 DIAGNOSIS — R0789 Other chest pain: Secondary | ICD-10-CM | POA: Insufficient documentation

## 2019-12-10 DIAGNOSIS — R079 Chest pain, unspecified: Secondary | ICD-10-CM | POA: Diagnosis not present

## 2019-12-10 DIAGNOSIS — Z20822 Contact with and (suspected) exposure to covid-19: Secondary | ICD-10-CM | POA: Diagnosis not present

## 2019-12-10 LAB — SARS CORONAVIRUS 2 (TAT 6-24 HRS): SARS Coronavirus 2: NEGATIVE

## 2019-12-10 MED ORDER — BENZONATATE 200 MG PO CAPS: 200.0000 mg | ORAL_CAPSULE | Freq: Three times a day (TID) | ORAL | 0 refills | Status: DC | PRN

## 2019-12-10 MED ORDER — NAPROXEN 500 MG PO TABS: 500.0000 mg | ORAL_TABLET | Freq: Two times a day (BID) | ORAL | 0 refills | Status: DC

## 2019-12-10 MED ORDER — ALBUTEROL SULFATE HFA 108 (90 BASE) MCG/ACT IN AERS: 1.0000 | INHALATION_SPRAY | Freq: Four times a day (QID) | RESPIRATORY_TRACT | 0 refills | Status: AC | PRN

## 2019-12-10 MED ORDER — AEROCHAMBER PLUS MISC: 2 refills | Status: DC

## 2019-12-10 NOTE — ED Provider Notes (Signed)
HPI  SUBJECTIVE:  Kristin Huynh is a 44 y.o. female who presents with diffuse constant achy throbbing chest pain for the past 3 days.  She states it is worse on the right side.  She reports headaches, cough productive of yellow-white phlegm, wheezing at night, shortness of breath secondary to the pain.  No fevers above 100.4, body aches, nasal congestion, sinus pain or pressure, postnasal drip, sore throat.  No loss of sense of taste or smell, nausea no vomiting, diarrhea, abdominal pain.  No known Covid exposure.  She got both doses of the Covid vaccine, last one in April.  No antibiotics in the past month.  No antipyretic in the past 6 to 8 hours.  No trauma to the chest, change in her physical activity.  She tried rest, pushing fluids.  Symptoms are better with sleeping with the head of her bed elevated.  Symptoms are worse when she lies down flat, deep inspiration, torso rotation, arm movement.  She has had chest pain like this before and was found to have pneumonia.  She denies calf pain, swelling, hemoptysis, surgery in the past 4 weeks, recent immobilization.  She has no history of pulmonary disease, smoking, diabetes, chronic kidney disease, coronary disease, hypertension, cancer, HIV, immunocompromise, PE, DVT, hypercoagulability.  She is on an estradiol patch.  LMP: Status post hysterectomy.  WSF:KCLEXN, Azucena Kuba, MD     Past Medical History:  Diagnosis Date  . GERD (gastroesophageal reflux disease)   . Hx of migraine headaches     Past Surgical History:  Procedure Laterality Date  . BREAST SURGERY     R breast mass removed- benign    Family History  Problem Relation Age of Onset  . Cancer Mother   . Diabetes Father     Social History   Tobacco Use  . Smoking status: Former Smoker    Packs/day: 0.25    Types: Cigarettes  . Smokeless tobacco: Never Used  Substance Use Topics  . Alcohol use: Yes    Comment: occasionally  . Drug use: No    No current  facility-administered medications for this encounter.  Current Outpatient Medications:  .  albuterol (VENTOLIN HFA) 108 (90 Base) MCG/ACT inhaler, Inhale 1-2 puffs into the lungs every 6 (six) hours as needed for wheezing or shortness of breath., Disp: 1 each, Rfl: 0 .  benzonatate (TESSALON) 200 MG capsule, Take 1 capsule (200 mg total) by mouth 3 (three) times daily as needed for cough., Disp: 30 capsule, Rfl: 0 .  metoCLOPramide (REGLAN) 10 MG tablet, Take 1 tablet (10 mg total) by mouth every 6 (six) hours as needed for nausea (or headache)., Disp: 30 tablet, Rfl: 0 .  naproxen (NAPROSYN) 500 MG tablet, Take 1 tablet (500 mg total) by mouth 2 (two) times daily., Disp: 20 tablet, Rfl: 0 .  Spacer/Aero-Holding Chambers (AEROCHAMBER PLUS) inhaler, Use as instructed, Disp: 1 each, Rfl: 2  Allergies  Allergen Reactions  . Penicillins Hives  . Sumatriptan Nausea And Vomiting  . Other Hives    *Red Peppers*     ROS  As noted in HPI.   Physical Exam  BP 119/90 (BP Location: Right Arm)   Pulse 66   Temp 98 F (36.7 C) (Oral)   Resp 18   LMP 01/28/2016   SpO2 99%   Constitutional: Well developed, well nourished, no acute distress Eyes: PERRL, EOMI, conjunctiva normal bilaterally HENT: Normocephalic, atraumatic,mucus membranes moist Respiratory: Clear to auscultation bilaterally, no rales, no wheezing, no rhonchi.  Normal inspiratory effort.  Patient able to take a full deep breath in Cardiovascular: Normal rate and rhythm, no murmurs, no gallops, no rubs. Positive reproduceable anterior and lateral chest wall tenderness at the costochondral and sternal chondral junctions.   GI: Soft, nondistended,  nontender, no rebound, no guarding skin: No rash, skin intact Musculoskeletal: Calves symmetric, nontender no edema, no tenderness, no deformities Neurologic: Alert & oriented x 3, CN III-XII grossly intact, no motor deficits, sensation grossly intact Psychiatric: Speech and behavior  appropriate   ED Course   Medications - No data to display  Orders Placed This Encounter  Procedures  . SARS CORONAVIRUS 2 (TAT 6-24 HRS) Nasopharyngeal Nasopharyngeal Swab    Standing Status:   Standing    Number of Occurrences:   1    Order Specific Question:   Is this test for diagnosis or screening    Answer:   Diagnosis of ill patient    Order Specific Question:   Symptomatic for COVID-19 as defined by CDC    Answer:   Yes    Order Specific Question:   Date of Symptom Onset    Answer:   12/07/2019    Order Specific Question:   Hospitalized for COVID-19    Answer:   Yes    Order Specific Question:   Admitted to ICU for COVID-19    Answer:   No    Order Specific Question:   Previously tested for COVID-19    Answer:   No    Order Specific Question:   Resident in a congregate (group) care setting    Answer:   No    Order Specific Question:   Employed in healthcare setting    Answer:   Yes    Order Specific Question:   Pregnant    Answer:   No    Order Specific Question:   Has patient completed COVID vaccination(s) (2 doses of Pfizer/Moderna 1 dose of [employer])    Answer:   Yes  . DG Chest 2 View    Standing Status:   Standing    Number of Occurrences:   1    Order Specific Question:   Reason for Exam (SYMPTOM  OR DIAGNOSIS REQUIRED)    Answer:   Chest pain rule out pneumonia effusion pneumothorax   No results found for this or any previous visit (from the past 24 hour(s)). DG Chest 2 View  Result Date: 12/10/2019 CLINICAL DATA:  Chest pain. EXAM: CHEST - 2 VIEW COMPARISON:  February 13, 2015. FINDINGS: The heart size and mediastinal contours are within normal limits. Both lungs are clear. No pneumothorax or pleural effusion is noted. The visualized skeletal structures are unremarkable. IMPRESSION: No active cardiopulmonary disease. Electronically Signed   By: Lupita Raider M.D.   On: 12/10/2019 12:39   Results for orders placed or performed during the hospital  encounter of 12/10/19  SARS CORONAVIRUS 2 (TAT 6-24 HRS) Nasopharyngeal Nasopharyngeal Swab   Specimen: Nasopharyngeal Swab  Result Value Ref Range   SARS Coronavirus 2 NEGATIVE NEGATIVE    ED Clinical Impression  1. Musculoskeletal chest pain   2. Encounter for laboratory testing for COVID-19 virus   3. Viral illness      ED Assessment/Plan  Covid sent.  Patient has diffuse reproducible chest wall pain.  It is aggravated with torso rotation and arm movement.  Doubt ACS. HEAR score 0.  Her only risk factor for PE is exogenous estrogen use, however she is not  tachycardic, hypoxic there is no sign of DVT.  She has no other risk factors for PE.  Doubt PE.  Deferring D-dimer.  Obtaining x-ray to rule out pneumonia, pneumothorax, effusion.  If normal, will send home with Naprosyn 500 mg combined with 1000 g of Tylenol twice a day.  May take an additional gram of Tylenol 2 more times a day.  Follow-up with PMD as needed, to the ER if she gets worse.  Reviewed imaging independently.  Normal chest x-ray. See radiology report for full details.  Presentation consistent with musculoskeletal chest pain.  Adding Tessalon, albuterol inhaler with a spacer for coughing, wheezing.  Plan as above.  COVID negative.   Discussed imaging, MDM, treatment plan, and plan for follow-up with patient Discussed sn/sx that should prompt return to the ED. patient agrees with plan.   Meds ordered this encounter  Medications  . albuterol (VENTOLIN HFA) 108 (90 Base) MCG/ACT inhaler    Sig: Inhale 1-2 puffs into the lungs every 6 (six) hours as needed for wheezing or shortness of breath.    Dispense:  1 each    Refill:  0  . benzonatate (TESSALON) 200 MG capsule    Sig: Take 1 capsule (200 mg total) by mouth 3 (three) times daily as needed for cough.    Dispense:  30 capsule    Refill:  0  . Spacer/Aero-Holding Chambers (AEROCHAMBER PLUS) inhaler    Sig: Use as instructed    Dispense:  1 each    Refill:  2   . DISCONTD: naproxen (NAPROSYN) 500 MG tablet    Sig: Take 1 tablet (500 mg total) by mouth 2 (two) times daily.    Dispense:  20 tablet    Refill:  0  . naproxen (NAPROSYN) 500 MG tablet    Sig: Take 1 tablet (500 mg total) by mouth 2 (two) times daily.    Dispense:  20 tablet    Refill:  0    *This clinic note was created using Scientist, clinical (histocompatibility and immunogenetics). Therefore, there may be occasional mistakes despite careful proofreading.  ?    Domenick Gong, MD 12/12/19 [phone]

## 2019-12-10 NOTE — ED Triage Notes (Signed)
Pt presents with chest pain, fatigue, back pain, Coughing at night and fever xs 3 days.   Denies loss of taste or smell, n,v,d.

## 2019-12-10 NOTE — Discharge Instructions (Addendum)
Your chest x-ray was negative for pneumonia.  I feel that you are very low risk for a blood clot in your lung so I am not doing further blood work at this time.  I am sending you home with Tessalon for the cough, 2 puffs from your albuterol inhaler using a spacer every 4 hours as needed for wheezing, coughing, take Naprosyn 500 mg twice a day with 2 extra strength, or 1000 mg, of Tylenol twice a day.  May take an additional 1000 mg of Tylenol 2 more times a day.  Do not exceed 4000 mg of Tylenol from all sources in 1 day.  Your Covid test will be back in 24 hours.

## 2020-04-06 ENCOUNTER — Ambulatory Visit: Payer: 59 | Attending: Internal Medicine

## 2020-04-06 ENCOUNTER — Other Ambulatory Visit (HOSPITAL_COMMUNITY): Payer: Self-pay | Admitting: Internal Medicine

## 2020-04-06 DIAGNOSIS — Z23 Encounter for immunization: Secondary | ICD-10-CM

## 2020-04-06 NOTE — Progress Notes (Signed)
   Covid-19 Vaccination Clinic  Name:  Myisha Pickerel    MRN: [id number] DOB: 1975/08/16  04/06/2020  Ms. Manson Passey was observed post Covid-19 immunization for 15 minutes without incident. She was provided with Vaccine Information Sheet and instruction to access the V-Safe system.   Ms. Manson Passey was instructed to call 911 with any severe reactions post vaccine: Marland Kitchen Difficulty breathing  . Swelling of face and throat  . A fast heartbeat  . A bad rash all over body  . Dizziness and weakness   Immunizations Administered    Name Date Dose VIS Date Route   Pfizer COVID-19 Vaccine 04/06/2020 10:14 AM 0.3 mL 01/20/2020 Intramuscular   Manufacturer: [employer], [employer]   Lot: [id number]   NDC: [address]-5

## 2020-08-09 ENCOUNTER — Emergency Department (HOSPITAL_COMMUNITY)
Admission: EM | Admit: 2020-08-09 | Discharge: 2020-08-09 | Disposition: A | Discharge status: Home / Self Care | Payer: 59 | Attending: Emergency Medicine | Admitting: Emergency Medicine

## 2020-08-09 ENCOUNTER — Other Ambulatory Visit: Payer: Self-pay

## 2020-08-09 ENCOUNTER — Encounter (HOSPITAL_COMMUNITY): Payer: Self-pay | Admitting: Emergency Medicine

## 2020-08-09 DIAGNOSIS — Z5321 Procedure and treatment not carried out due to patient leaving prior to being seen by health care provider: Secondary | ICD-10-CM | POA: Diagnosis not present

## 2020-08-09 DIAGNOSIS — R1030 Lower abdominal pain, unspecified: Secondary | ICD-10-CM | POA: Insufficient documentation

## 2020-08-09 DIAGNOSIS — R110 Nausea: Secondary | ICD-10-CM | POA: Diagnosis not present

## 2020-08-09 MED ORDER — ONDANSETRON 4 MG PO TBDP
4.0000 mg | ORAL_TABLET | Freq: Once | ORAL | Status: AC
  Administered 2020-08-09: 4 mg via ORAL
  Filled 2020-08-09: qty 1

## 2020-08-09 NOTE — ED Provider Notes (Cosign Needed)
Emergency Medicine Provider Triage Evaluation Note  Mireya Meditz , a 45 y.o. female  was evaluated in triage.  Pt complains of intermittent lower abdominal pain onset this morning. H/o hysterectomy. Had right low back pain last week  Review of Systems  Positive: Abdominal pain nausea Negative: Vomiting, diarrhea, dysuria, vaginal discharge or bleeding  Physical Exam  BP (!) 150/104 (BP Location: Left Arm)   Pulse 73   Temp 99 F (37.2 C) (Oral)   Resp (!) 24   LMP 01/28/2016   SpO2 100%  Gen:   Awake, tearful   Resp:  Normal effort MSK:   Moves extremities without difficulty  Abd:  Non tender non distended abdomen, no CVAT. Patient states she is not having abd pain right now  Medical Decision Making  Medically screening exam initiated at 6:34 PM.  Appropriate orders placed.  Taiyana Kissler was informed that the remainder of the evaluation will be completed by another provider, this initial triage assessment does not replace that evaluation, and the importance of remaining in the ED until their evaluation is complete.   Liberty Handy, PA-C 08/09/20 1835

## 2020-08-09 NOTE — ED Triage Notes (Signed)
Pt c/o lower abdominal pain with nausea that started this morning. Denies vomiting/diarrhea, no urinary changes.

## 2020-08-09 NOTE — ED Notes (Signed)
Called pts name 3x to be roomed with no response.  

## 2020-08-10 LAB — COMPREHENSIVE METABOLIC PANEL
ALT: 21 U/L (ref 0–44)
AST: 24 U/L (ref 15–41)
Albumin: 3.7 g/dL (ref 3.5–5.0)
Alkaline Phosphatase: 60 U/L (ref 38–126)
Anion gap: 9 (ref 5–15)
BUN: 11 mg/dL (ref 6–20)
CO2: 24 mmol/L (ref 22–32)
Calcium: 9.4 mg/dL (ref 8.9–10.3)
Chloride: 103 mmol/L (ref 98–111)
Creatinine, Ser: 0.73 mg/dL (ref 0.44–1.00)
GFR, Estimated: >60 mL/min (ref >60)
Glucose, Bld: 74 mg/dL (ref 70–99)
Potassium: 3.8 mmol/L (ref 3.5–5.1)
Sodium: 136 mmol/L (ref 135–145)
Total Bilirubin: <0.1 mg/dL — ABNORMAL LOW (ref 0.3–1.2)
Total Protein: 7.5 g/dL (ref 6.5–8.1)

## 2020-08-10 LAB — CBC
HCT: 43.8 % (ref 36.0–46.0)
Hemoglobin: 14.1 g/dL (ref 12.0–15.0)
MCH: 30.8 pg (ref 26.0–34.0)
MCHC: 32.2 g/dL (ref 30.0–36.0)
MCV: 95.6 fL (ref 80.0–100.0)
Platelets: 508 10*3/uL — ABNORMAL HIGH (ref 150–400)
RBC: 4.58 MIL/uL (ref 3.87–5.11)
RDW: 13.3 % (ref 11.5–15.5)
WBC: 8.3 10*3/uL (ref 4.0–10.5)
nRBC: 0 % (ref 0.0–0.2)

## 2020-08-10 LAB — LIPASE, BLOOD: Lipase: 25 U/L (ref 11–51)

## 2021-01-23 ENCOUNTER — Other Ambulatory Visit: Payer: Self-pay | Admitting: Family

## 2021-01-23 DIAGNOSIS — Z1231 Encounter for screening mammogram for malignant neoplasm of breast: Secondary | ICD-10-CM

## 2021-01-24 ENCOUNTER — Ambulatory Visit
Admission: RE | Admit: 2021-01-24 | Discharge: 2021-01-24 | Disposition: A | Discharge status: Home / Self Care | Payer: 59 | Source: Ambulatory Visit | Attending: Family | Admitting: Family

## 2021-01-24 ENCOUNTER — Other Ambulatory Visit: Payer: Self-pay

## 2021-01-24 DIAGNOSIS — Z1231 Encounter for screening mammogram for malignant neoplasm of breast: Secondary | ICD-10-CM

## 2021-09-04 ENCOUNTER — Other Ambulatory Visit: Payer: Self-pay

## 2021-09-04 ENCOUNTER — Encounter (HOSPITAL_COMMUNITY): Payer: Self-pay | Admitting: Emergency Medicine

## 2021-09-04 ENCOUNTER — Emergency Department (HOSPITAL_COMMUNITY)
Admission: EM | Admit: 2021-09-04 | Discharge: 2021-09-04 | Disposition: A | Discharge status: Home / Self Care | Payer: Self-pay | Attending: Emergency Medicine | Admitting: Emergency Medicine

## 2021-09-04 ENCOUNTER — Emergency Department (HOSPITAL_COMMUNITY): Payer: Self-pay

## 2021-09-04 DIAGNOSIS — R1084 Generalized abdominal pain: Secondary | ICD-10-CM | POA: Diagnosis present

## 2021-09-04 DIAGNOSIS — Y92410 Unspecified street and highway as the place of occurrence of the external cause: Secondary | ICD-10-CM | POA: Diagnosis not present

## 2021-09-04 LAB — CBC
HCT: 41.6 % (ref 36.0–46.0)
Hemoglobin: 13.6 g/dL (ref 12.0–15.0)
MCH: 30.4 pg (ref 26.0–34.0)
MCHC: 32.7 g/dL (ref 30.0–36.0)
MCV: 92.9 fL (ref 80.0–100.0)
Platelets: 473 10*3/uL — ABNORMAL HIGH (ref 150–400)
RBC: 4.48 MIL/uL (ref 3.87–5.11)
RDW: 14.1 % (ref 11.5–15.5)
WBC: 8.9 10*3/uL (ref 4.0–10.5)
nRBC: 0 % (ref 0.0–0.2)

## 2021-09-04 LAB — BASIC METABOLIC PANEL
Anion gap: 7 (ref 5–15)
BUN: 16 mg/dL (ref 6–20)
CO2: 24 mmol/L (ref 22–32)
Calcium: 9.8 mg/dL (ref 8.9–10.3)
Chloride: 109 mmol/L (ref 98–111)
Creatinine, Ser: 0.78 mg/dL (ref 0.44–1.00)
GFR, Estimated: >60 mL/min (ref >60)
Glucose, Bld: 104 mg/dL — ABNORMAL HIGH (ref 70–99)
Potassium: 3.7 mmol/L (ref 3.5–5.1)
Sodium: 140 mmol/L (ref 135–145)

## 2021-09-04 MED ORDER — HYDROCODONE-ACETAMINOPHEN 5-325 MG PO TABS: 2.0000 | ORAL_TABLET | Freq: Four times a day (QID) | ORAL | 0 refills | Status: AC | PRN

## 2021-09-04 MED ORDER — IOHEXOL 300 MG/ML  SOLN
100.0000 mL | Freq: Once | INTRAMUSCULAR | Status: AC | PRN
  Administered 2021-09-04: 100 mL via INTRAVENOUS

## 2021-09-04 MED ORDER — FENTANYL CITRATE PF 50 MCG/ML IJ SOSY
50.0000 ug | PREFILLED_SYRINGE | Freq: Once | INTRAMUSCULAR | Status: AC
  Administered 2021-09-04: 50 ug via INTRAVENOUS
  Filled 2021-09-04: qty 1
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
## 2022-01-16 ENCOUNTER — Emergency Department (HOSPITAL_BASED_OUTPATIENT_CLINIC_OR_DEPARTMENT_OTHER): Payer: Medicaid Other | Admitting: Radiology

## 2022-01-16 ENCOUNTER — Encounter (HOSPITAL_BASED_OUTPATIENT_CLINIC_OR_DEPARTMENT_OTHER): Payer: Self-pay | Admitting: Emergency Medicine

## 2022-01-16 ENCOUNTER — Other Ambulatory Visit: Payer: Self-pay

## 2022-01-16 ENCOUNTER — Emergency Department (HOSPITAL_BASED_OUTPATIENT_CLINIC_OR_DEPARTMENT_OTHER)
Admission: EM | Admit: 2022-01-16 | Discharge: 2022-01-16 | Disposition: A | Discharge status: Home / Self Care | Payer: Medicaid Other | Attending: Emergency Medicine | Admitting: Emergency Medicine

## 2022-01-16 DIAGNOSIS — Z20822 Contact with and (suspected) exposure to covid-19: Secondary | ICD-10-CM | POA: Insufficient documentation

## 2022-01-16 DIAGNOSIS — R0789 Other chest pain: Secondary | ICD-10-CM | POA: Insufficient documentation

## 2022-01-16 LAB — BASIC METABOLIC PANEL
Anion gap: 10 (ref 5–15)
BUN: 9 mg/dL (ref 6–20)
CO2: 23 mmol/L (ref 22–32)
Calcium: 10 mg/dL (ref 8.9–10.3)
Chloride: 103 mmol/L (ref 98–111)
Creatinine, Ser: 0.71 mg/dL (ref 0.44–1.00)
GFR, Estimated: >60 mL/min (ref >60)
Glucose, Bld: 87 mg/dL (ref 70–99)
Potassium: 3.4 mmol/L — ABNORMAL LOW (ref 3.5–5.1)
Sodium: 136 mmol/L (ref 135–145)

## 2022-01-16 LAB — CBC
HCT: 43.7 % (ref 36.0–46.0)
Hemoglobin: 14.2 g/dL (ref 12.0–15.0)
MCH: 29.8 pg (ref 26.0–34.0)
MCHC: 32.5 g/dL (ref 30.0–36.0)
MCV: 91.8 fL (ref 80.0–100.0)
Platelets: 472 10*3/uL — ABNORMAL HIGH (ref 150–400)
RBC: 4.76 MIL/uL (ref 3.87–5.11)
RDW: 14.1 % (ref 11.5–15.5)
WBC: 6.9 10*3/uL (ref 4.0–10.5)
nRBC: 0 % (ref 0.0–0.2)

## 2022-01-16 LAB — TROPONIN I (HIGH SENSITIVITY): Troponin I (High Sensitivity): <2 ng/L (ref <18)

## 2022-01-16 LAB — SARS CORONAVIRUS 2 BY RT PCR: SARS Coronavirus 2 by RT PCR: NEGATIVE

## 2022-01-16 MED ORDER — LIDOCAINE 5 % EX PTCH: 1.0000 | MEDICATED_PATCH | CUTANEOUS | 0 refills | Status: DC

## 2022-01-16 MED ORDER — NAPROXEN 500 MG PO TABS: 500.0000 mg | ORAL_TABLET | Freq: Two times a day (BID) | ORAL | 0 refills | Status: AC

## 2022-01-16 MED ORDER — LIDOCAINE 5 % EX PTCH
1.0000 | MEDICATED_PATCH | CUTANEOUS | Status: DC
  Administered 2022-01-16: 1 via TRANSDERMAL
  Filled 2022-01-16: qty 1

## 2022-01-16 MED ORDER — BENZONATATE 200 MG PO CAPS: 200.0000 mg | ORAL_CAPSULE | Freq: Three times a day (TID) | ORAL | 0 refills | Status: AC | PRN

## 2022-01-16 MED ORDER — NAPROXEN 250 MG PO TABS
500.0000 mg | ORAL_TABLET | Freq: Once | ORAL | Status: AC
  Administered 2022-01-16: 500 mg via ORAL
  Filled 2022-01-16: qty 2

## 2022-01-16 MED ORDER — METHOCARBAMOL 500 MG PO TABS: 500.0000 mg | ORAL_TABLET | Freq: Two times a day (BID) | ORAL | 0 refills | Status: DC

## 2022-01-16 NOTE — ED Notes (Signed)
Reviewed AVS/discharge instruction with patient. Time allotted for and all questions answered. Patient is agreeable for d/c and escorted to ed exit by staff.  

## 2022-01-16 NOTE — Discharge Instructions (Addendum)
Your work-up today is unremarkable.  There are no current concerns for heart attack, clots or any other concerning diagnoses.  This is likely a muscle strain.  I have sent the following medications to your pharmacy Methocarbamol is a muscle relaxant that may make you tired at night so be sure not to drive or operate machinery on this.  Also do not drink alcohol with this Naproxen is the NSAID that may help with pain and inflammation.  Similar to ibuprofen so do not take ibuprofen at the same time.  It is safe to alternate with Tylenol Lidocaine patches.  Remember that these can only be worn for 12 hours at a time. Benzonatate.  These are the cough pills for you to use if he needs them.    Your work note is attached.  Please return with any worsening symptoms, especially worsening chest pain, inability to catch her breath, dizziness or feeling as though you may lose consciousness.

## 2022-01-16 NOTE — ED Provider Notes (Signed)
[facility] EMERGENCY DEPT Provider Note   CSN: [id number] Arrival date & time: 01/16/22  1328     History  Chief Complaint  Patient presents with   Chest Pain    Kristin Huynh is a 46 y.o. female presenting today with chest pain.  Reports it was present when she woke up this morning.  Localized to the left side of her chest and is worse when bends over or reaches forward. No history of ACS but does endorse some pain when she exhales as well as palpitations.  Pain does not radiate.  Says that this happened in the past after she had surgery at she was told it might be musculoskeletal.   Chest Pain      Home Medications Prior to Admission medications   Medication Sig Start Date End Date Taking? Authorizing Provider  albuterol (VENTOLIN HFA) 108 (90 Base) MCG/ACT inhaler Inhale 1-2 puffs into the lungs every 6 (six) hours as needed for wheezing or shortness of breath. 12/10/19   Domenick Gong, MD  benzonatate (TESSALON) 200 MG capsule Take 1 capsule (200 mg total) by mouth 3 (three) times daily as needed for cough. 12/10/19   Domenick Gong, MD  HYDROcodone-acetaminophen (NORCO/VICODIN) 5-325 MG tablet Take 2 tablets by mouth every 6 (six) hours as needed for moderate pain. 09/04/21   Al Decant, PA-C  metoCLOPramide (REGLAN) 10 MG tablet Take 1 tablet (10 mg total) by mouth every 6 (six) hours as needed for nausea (or headache). 02/01/16   Dione Booze, MD  naproxen (NAPROSYN) 500 MG tablet Take 1 tablet (500 mg total) by mouth 2 (two) times daily. 12/10/19   Domenick Gong, MD  Spacer/Aero-Holding Chambers (AEROCHAMBER PLUS) inhaler Use as instructed 12/10/19   Domenick Gong, MD      Allergies    Penicillins, Sumatriptan, and Other    Review of Systems   Review of Systems  Cardiovascular:  Positive for chest pain.    Physical Exam Updated Vital Signs BP (!) 136/96 (BP Location: Left Arm)   Pulse (!) 59   Temp 98.3 F (36.8 C) (Oral)   Resp 17    Ht 5\' 4"  (1.626 m)   Wt 72.6 kg   LMP 01/28/2016   SpO2 100%   BMI 27.46 kg/m  Physical Exam Vitals and nursing note reviewed.  Constitutional:      General: She is not in acute distress.    Appearance: Normal appearance. She is not ill-appearing.  HENT:     Head: Normocephalic and atraumatic.  Eyes:     General: No scleral icterus.    Conjunctiva/sclera: Conjunctivae normal.  Cardiovascular:     Rate and Rhythm: Normal rate and regular rhythm.     Heart sounds: Normal heart sounds.  Pulmonary:     Effort: Pulmonary effort is normal. No respiratory distress.     Breath sounds: Normal breath sounds.  Chest:     Chest wall: Tenderness present.  Abdominal:     Palpations: Abdomen is soft.  Musculoskeletal:     Right lower leg: No tenderness. No edema.     Left lower leg: No tenderness. No edema.  Skin:    General: Skin is warm and dry.     Findings: No rash.  Neurological:     Mental Status: She is alert.  Psychiatric:        Mood and Affect: Mood normal.     ED Results / Procedures / Treatments   Labs (all labs ordered are listed, but  only abnormal results are displayed) Labs Reviewed  BASIC METABOLIC PANEL - Abnormal; Notable for the following components:      Result Value   Potassium 3.4 (*)    All other components within normal limits  CBC - Abnormal; Notable for the following components:   Platelets 472 (*)    All other components within normal limits  TROPONIN I (HIGH SENSITIVITY)  TROPONIN I (HIGH SENSITIVITY)    EKG EKG Interpretation  Date/Time:  Tuesday January 16 2022 13:42:41 EDT Ventricular Rate:  59 PR Interval:  142 QRS Duration: 80 QT Interval:  430 QTC Calculation: 425 R Axis:   -51 Text Interpretation: Sinus bradycardia Left axis deviation Abnormal ECG When compared with ECG of 13-Feb-2015 13:18, PREVIOUS ECG IS PRESENT Confirmed by Regan Lemming ([id number]) on 01/16/2022 2:14:04 PM  Radiology DG Chest 2 View  Result Date:  01/16/2022 CLINICAL DATA:  Chest pain. EXAM: CHEST - 2 VIEW COMPARISON:  Chest radiograph dated 12/10/2019. FINDINGS: The heart size and mediastinal contours are within normal limits. Both lungs are clear. The visualized skeletal structures are unremarkable. IMPRESSION: No active cardiopulmonary disease. Electronically Signed   By: Anner Crete M.D.   On: 01/16/2022 14:04    Procedures Procedures   Medications Ordered in ED Medications - No data to display  ED Course/ Medical Decision Making/ A&P                           Medical Decision Making Amount and/or Complexity of Data Reviewed Labs: ordered. Radiology: ordered.  Risk Prescription drug management.   46 year old female presenting with chest pain. The emergent differential diagnosis of chest pain includes: Acute coronary syndrome, pericarditis, aortic dissection, pulmonary embolism, tension pneumothorax, and esophageal rupture.   This is not an exhaustive differential.    Past Medical History / Co-morbidities / Social History: None, non-smoker   Additional history: Per chart review the same thing happened in 2021 and was found to be musculoskeletal   Physical Exam: Pertinent physical exam findings include   Lab Tests: I ordered, and personally interpreted labs.  The pertinent results include: Reproducible chest pain on the anterior left chest wall   Imaging Studies: I ordered and independently visualized and interpreted the x-ray and I agree with the radiologist that no acute findings   Cardiac Monitoring:  The patient was maintained on a cardiac monitor.  I viewed and interpreted the cardiac monitored which showed an underlying rhythm of: Normal sinus   Medications: I ordered medication including medication patch and naproxen.    MDM/Disposition: This is a 46 year old female presenting with chest pain.  It is reproducible on physical exam.  No known medical history.  Heart score 0 after 1 negative  troponin.  Do not believe we need a delta.  Also PERC negative.  I suspect this is a musculoskeletal etiology, especially being that it is worse when she is bending over or twisting her torso.  Also reproducible on physical exam.  Her husband reported she was coughing all night and it may be secondary to repetitive expansion of her thorax.  Regardless, it does not appear to have an emergent condition requiring further intervention today.  She is agreeable to discharge with NSAIDs, lidocaine patches and muscle relaxants and a follow-up.  We discussed return precautions, especially those of PE and ACS.  Discharged   Final Clinical Impression(s) / ED Diagnoses Final diagnoses:  Musculoskeletal chest pain    Rx / DC Orders ED  Discharge Orders          Ordered    benzonatate (TESSALON) 200 MG capsule  3 times daily PRN        01/16/22 1627    methocarbamol (ROBAXIN) 500 MG tablet  2 times daily        01/16/22 1627    naproxen (NAPROSYN) 500 MG tablet  2 times daily        01/16/22 1627    lidocaine (LIDODERM) 5 %  Every 24 hours        01/16/22 1638           Results and diagnoses were explained to the patient. Return precautions discussed in full. Patient had no additional questions and expressed complete understanding.   This chart was dictated using voice recognition software.  Despite best efforts to proofread,  errors can occur which can change the documentation meaning.    Saddie Benders, PA-C 01/16/22 1639    Melene Plan, DO 01/16/22 1649

## 2022-01-16 NOTE — ED Triage Notes (Signed)
Pt via pov from home with chest pain since she awakened this morning. She reports it is constant and sharp; it hurts worse when she bends over. She also reports lack of appetite today as well. Pt endorses sob, n/v, denies diaphoresis. Pt alert & oriented, nad noted.

## 2022-03-02 ENCOUNTER — Emergency Department (HOSPITAL_COMMUNITY)
Admission: EM | Admit: 2022-03-02 | Discharge: 2022-03-03 | Disposition: A | Discharge status: Home / Self Care | Payer: BC Managed Care – PPO | Attending: Emergency Medicine | Admitting: Emergency Medicine

## 2022-03-02 DIAGNOSIS — J101 Influenza due to other identified influenza virus with other respiratory manifestations: Secondary | ICD-10-CM | POA: Diagnosis not present

## 2022-03-02 DIAGNOSIS — R509 Fever, unspecified: Secondary | ICD-10-CM | POA: Diagnosis present

## 2022-03-02 DIAGNOSIS — Z1152 Encounter for screening for COVID-19: Secondary | ICD-10-CM | POA: Insufficient documentation

## 2022-03-02 DIAGNOSIS — Z87891 Personal history of nicotine dependence: Secondary | ICD-10-CM | POA: Diagnosis not present

## 2022-03-03 ENCOUNTER — Emergency Department (HOSPITAL_COMMUNITY): Payer: BC Managed Care – PPO

## 2022-03-03 ENCOUNTER — Encounter (HOSPITAL_COMMUNITY): Payer: Self-pay

## 2022-03-03 ENCOUNTER — Other Ambulatory Visit: Payer: Self-pay

## 2022-03-03 LAB — RESP PANEL BY RT-PCR (FLU A&B, COVID) ARPGX2
Influenza A by PCR: POSITIVE — AB
Influenza B by PCR: NEGATIVE
SARS Coronavirus 2 by RT PCR: NEGATIVE

## 2022-03-03 MED ORDER — OSELTAMIVIR PHOSPHATE 75 MG PO CAPS
75.0000 mg | ORAL_CAPSULE | Freq: Once | ORAL | Status: AC
  Administered 2022-03-03: 75 mg via ORAL
  Filled 2022-03-03: qty 1

## 2022-03-03 MED ORDER — NAPROXEN 500 MG PO TABS: 500.0000 mg | ORAL_TABLET | Freq: Two times a day (BID) | ORAL | 0 refills | Status: AC

## 2022-03-03 MED ORDER — NALOXONE HCL 4 MG/0.1ML NA LIQD: 1.0000 | Freq: Once | NASAL | 0 refills | Status: DC

## 2022-03-03 MED ORDER — ACETAMINOPHEN 325 MG PO TABS
650.0000 mg | ORAL_TABLET | Freq: Once | ORAL | Status: AC | PRN
  Administered 2022-03-03: 650 mg via ORAL
  Filled 2022-03-03: qty 2

## 2022-03-03 MED ORDER — OSELTAMIVIR PHOSPHATE 75 MG PO CAPS: 75.0000 mg | ORAL_CAPSULE | Freq: Two times a day (BID) | ORAL | 0 refills | Status: AC

## 2022-03-03 MED ORDER — ONDANSETRON 4 MG PO TBDP: 4.0000 mg | ORAL_TABLET | Freq: Three times a day (TID) | ORAL | 0 refills | Status: DC | PRN

## 2022-03-03 MED ORDER — OSELTAMIVIR PHOSPHATE 75 MG PO CAPS: 75.0000 mg | ORAL_CAPSULE | Freq: Two times a day (BID) | ORAL | 0 refills | Status: DC

## 2022-03-03 MED ORDER — NAPROXEN 500 MG PO TABS
500.0000 mg | ORAL_TABLET | Freq: Once | ORAL | Status: AC
  Administered 2022-03-03: 500 mg via ORAL
  Filled 2022-03-03: qty 1

## 2022-03-03 NOTE — ED Provider Notes (Signed)
[facility]ER[facility]ENCY DEPT [facility] Emergency Department Provider Note MRN:  [id number]  Arrival date & time: 03/03/22     Chief Complaint   Fever   History of Present Illness   Kristin Huynh is a 46 y.o. year-old female with no pertinent past medical history presenting to the ED with chief complaint of fever.  Cough, body aches, malaise, fatigue, headaches, nausea.  Been sick for about 2 days.  Febrile as well.  No shortness of breath, no abdominal pain.  Review of Systems  A thorough review of systems was obtained and all systems are negative except as noted in the HPI and PMH.   Patient's Health History    Past Medical History:  Diagnosis Date   GERD (gastroesophageal reflux disease)    Hx of migraine headaches     Past Surgical History:  Procedure Laterality Date   BREAST SURGERY     R breast mass removed- benign    Family History  Problem Relation Age of Onset   Cancer Mother    Diabetes Father    Breast cancer Neg Hx     Social History   Socioeconomic History   Marital status: Single    Spouse name: Not on file   Number of children: Not on file   Years of education: Not on file   Highest education level: Not on file  Occupational History   Not on file  Tobacco Use   Smoking status: Former    Packs/day: 0.25    Types: Cigarettes   Smokeless tobacco: Never  Substance and Sexual Activity   Alcohol use: Yes    Comment: occasionally   Drug use: No   Sexual activity: Yes    Birth control/protection: Condom  Other Topics Concern   Not on file  Social History Narrative   Not on file   Social Determinants of Health   Financial Resource Strain: Not on file  Food Insecurity: Not on file  Transportation Needs: Not on file  Physical Activity: Not on file  Stress: Not on file  Social Connections: Not on file  Intimate Partner Violence: Not on file     Physical Exam   Vitals:   03/02/22 2355 03/03/22 0000  BP: (!) 150/98 139/84  Pulse: 91 95  Resp:  19   Temp: (!) 101.7 F (38.7 C)   SpO2: 99% 91%    CONSTITUTIONAL: Well-appearing, NAD NEURO/PSYCH:  Alert and oriented x 3, no focal deficits EYES:  eyes equal and reactive ENT/NECK:  no LAD, no JVD CARDIO: Regular rate, well-perfused, normal S1 and S2 PULM:  CTAB no wheezing or rhonchi GI/GU:  non-distended, non-tender MSK/SPINE:  No gross deformities, no edema SKIN:  no rash, atraumatic   *Additional and/or pertinent findings included in MDM below  Diagnostic and Interventional Summary    EKG Interpretation  Date/Time:    Ventricular Rate:    PR Interval:    QRS Duration:   QT Interval:    QTC Calculation:   R Axis:     Text Interpretation:         Labs Reviewed  RESP PANEL BY RT-PCR (FLU A&B, COVID) ARPGX2 - Abnormal; Notable for the following components:      Result Value   Influenza A by PCR POSITIVE (*)    All other components within normal limits    DG Chest 2 View  Final Result      Medications  oseltamivir (TAMIFLU) capsule 75 mg (has no administration in time range)  naproxen (  NAPROSYN) tablet 500 mg (has no administration in time range)  acetaminophen (TYLENOL) tablet 650 mg (650 mg Oral Given 03/03/22 0015)     Procedures  /  Critical Care Procedures  ED Course and Medical Decision Making  Initial Impression and Ddx Viral illness suspected, possibly flu or COVID.  Abdomen soft and nontender, lungs clear, no meningismus.  Has tested positive for flu here in the emergency department, no indication for further testing or admission, appropriate for discharge.  Past medical/surgical history that increases complexity of ED encounter: None  Interpretation of Diagnostics I personally reviewed the Chest Xray and my interpretation is as follows: No obvious opacity or pneumothorax    Patient Reassessment and Ultimate Disposition/Management     Discharge  Patient management required discussion with the following services or consulting groups:   None  Complexity of Problems Addressed Acute complicated illness or Injury  Additional Data Reviewed and Analyzed Further history obtained from: Further history from spouse/family member  Additional Factors Impacting ED Encounter Risk Prescriptions  Elmer Sow. Pilar Plate, MD [facility] Emergency Medicine [facility] [email or web address]  Final Clinical Impressions(s) / ED Diagnoses     ICD-10-CM   1. Influenza A  J10.1       ED Discharge Orders          Ordered    oseltamivir (TAMIFLU) 75 MG capsule  Every 12 hours        03/03/22 0215    naloxone (NARCAN) nasal spray 4 mg/0.1 mL   Once        03/03/22 0215    ondansetron (ZOFRAN-ODT) 4 MG disintegrating tablet  Every 8 hours PRN        03/03/22 0215             Discharge Instructions Discussed with and Provided to Patient:    Discharge Instructions      You were evaluated in the Emergency Department and after careful evaluation, we did not find any emergent condition requiring admission or further testing in the hospital.  Your exam/testing today is overall reassuring.  Symptoms seem to be due to influenza A and.  You tested positive here in the emergency department.  Use the Tamiflu daily to help recover faster.  Use the Zofran as needed for nausea.  Use the Naprosyn twice daily for fever or discomfort.  Please return to the Emergency Department if you experience any worsening of your condition.   Thank you for allowing [place] to be a part of your care.      Sabas Sous, MD 03/03/22 [phone]

## 2022-03-03 NOTE — ED Triage Notes (Signed)
Reports body aches, coughing, and subjective fevers since yesterday.

## 2022-03-03 NOTE — Discharge Instructions (Signed)
You were evaluated in the Emergency Department and after careful evaluation, we did not find any emergent condition requiring admission or further testing in the hospital.  Your exam/testing today is overall reassuring.  Symptoms seem to be due to influenza A and.  You tested positive here in the emergency department.  Use the Tamiflu daily to help recover faster.  Use the Zofran as needed for nausea.  Use the Naprosyn twice daily for fever or discomfort.  Please return to the Emergency Department if you experience any worsening of your condition.   Thank you for allowing [place] to be a part of your care.

## 2022-12-30 ENCOUNTER — Emergency Department (HOSPITAL_BASED_OUTPATIENT_CLINIC_OR_DEPARTMENT_OTHER): Payer: BC Managed Care – PPO

## 2022-12-30 ENCOUNTER — Other Ambulatory Visit: Payer: Self-pay

## 2022-12-30 ENCOUNTER — Encounter (HOSPITAL_BASED_OUTPATIENT_CLINIC_OR_DEPARTMENT_OTHER): Payer: Self-pay | Admitting: Urology

## 2022-12-30 ENCOUNTER — Emergency Department (HOSPITAL_BASED_OUTPATIENT_CLINIC_OR_DEPARTMENT_OTHER)
Admission: EM | Admit: 2022-12-30 | Discharge: 2022-12-30 | Disposition: A | Discharge status: Home / Self Care | Payer: BC Managed Care – PPO | Attending: Emergency Medicine | Admitting: Emergency Medicine

## 2022-12-30 DIAGNOSIS — I509 Heart failure, unspecified: Secondary | ICD-10-CM | POA: Diagnosis not present

## 2022-12-30 DIAGNOSIS — R0602 Shortness of breath: Secondary | ICD-10-CM | POA: Diagnosis not present

## 2022-12-30 DIAGNOSIS — R059 Cough, unspecified: Secondary | ICD-10-CM | POA: Insufficient documentation

## 2022-12-30 DIAGNOSIS — M62830 Muscle spasm of back: Secondary | ICD-10-CM | POA: Diagnosis not present

## 2022-12-30 DIAGNOSIS — Z20822 Contact with and (suspected) exposure to covid-19: Secondary | ICD-10-CM | POA: Diagnosis not present

## 2022-12-30 DIAGNOSIS — N200 Calculus of kidney: Secondary | ICD-10-CM | POA: Diagnosis not present

## 2022-12-30 DIAGNOSIS — R079 Chest pain, unspecified: Secondary | ICD-10-CM | POA: Diagnosis present

## 2022-12-30 LAB — RESP PANEL BY RT-PCR (RSV, FLU A&B, COVID)  RVPGX2
Influenza A by PCR: NEGATIVE
Influenza B by PCR: NEGATIVE
Resp Syncytial Virus by PCR: NEGATIVE
SARS Coronavirus 2 by RT PCR: NEGATIVE

## 2022-12-30 LAB — CBC WITH DIFFERENTIAL/PLATELET
Abs Immature Granulocytes: 0.01 10*3/uL (ref 0.00–0.07)
Basophils Absolute: 0.1 10*3/uL (ref 0.0–0.1)
Basophils Relative: 1 %
Eosinophils Absolute: 0.1 10*3/uL (ref 0.0–0.5)
Eosinophils Relative: 1 %
HCT: 39.9 % (ref 36.0–46.0)
Hemoglobin: 13.3 g/dL (ref 12.0–15.0)
Immature Granulocytes: 0 %
Lymphocytes Relative: 42 %
Lymphs Abs: 2.7 10*3/uL (ref 0.7–4.0)
MCH: 31 pg (ref 26.0–34.0)
MCHC: 33.3 g/dL (ref 30.0–36.0)
MCV: 93 fL (ref 80.0–100.0)
Monocytes Absolute: 0.7 10*3/uL (ref 0.1–1.0)
Monocytes Relative: 10 %
Neutro Abs: 2.9 10*3/uL (ref 1.7–7.7)
Neutrophils Relative %: 46 %
Platelets: 427 10*3/uL — ABNORMAL HIGH (ref 150–400)
RBC: 4.29 MIL/uL (ref 3.87–5.11)
RDW: 13.5 % (ref 11.5–15.5)
WBC: 6.4 10*3/uL (ref 4.0–10.5)
nRBC: 0 % (ref 0.0–0.2)

## 2022-12-30 LAB — BASIC METABOLIC PANEL
Anion gap: 9 (ref 5–15)
BUN: 10 mg/dL (ref 6–20)
CO2: 27 mmol/L (ref 22–32)
Calcium: 9.2 mg/dL (ref 8.9–10.3)
Chloride: 101 mmol/L (ref 98–111)
Creatinine, Ser: 0.75 mg/dL (ref 0.44–1.00)
GFR, Estimated: >60 mL/min (ref >60)
Glucose, Bld: 94 mg/dL (ref 70–99)
Potassium: 3.4 mmol/L — ABNORMAL LOW (ref 3.5–5.1)
Sodium: 137 mmol/L (ref 135–145)

## 2022-12-30 LAB — TROPONIN I (HIGH SENSITIVITY): Troponin I (High Sensitivity): 2 ng/L (ref <18)

## 2022-12-30 LAB — BRAIN NATRIURETIC PEPTIDE: B Natriuretic Peptide: 22.9 pg/mL (ref 0.0–100.0)

## 2022-12-30 MED ORDER — IOHEXOL 350 MG/ML SOLN
75.0000 mL | Freq: Once | INTRAVENOUS | Status: AC | PRN
  Administered 2022-12-30: 75 mL via INTRAVENOUS

## 2022-12-30 MED ORDER — ACETAMINOPHEN 325 MG PO TABS
650.0000 mg | ORAL_TABLET | Freq: Once | ORAL | Status: DC
  Filled 2022-12-30: qty 2

## 2022-12-30 MED ORDER — CYCLOBENZAPRINE HCL 10 MG PO TABS: 10.0000 mg | ORAL_TABLET | Freq: Two times a day (BID) | ORAL | 0 refills | Status: DC | PRN

## 2022-12-30 MED ORDER — MORPHINE SULFATE (PF) 4 MG/ML IV SOLN
4.0000 mg | Freq: Once | INTRAVENOUS | Status: AC
  Administered 2022-12-30: 4 mg via INTRAVENOUS
  Filled 2022-12-30: qty 1

## 2022-12-30 MED ORDER — CYCLOBENZAPRINE HCL 10 MG PO TABS: 10.0000 mg | ORAL_TABLET | Freq: Two times a day (BID) | ORAL | 0 refills | Status: AC | PRN

## 2022-12-30 NOTE — Discharge Instructions (Signed)
Please follow-up primary care provider regarding recent ER visit.  Today your CT scan is reassuring however did find a stone in your left kidney that is not currently in a position to cause you pain.  You most likely have a muscle spasm and I have prescribed you Flexeril to take at night.  This medicine is very sedating so do not operate machinery or drive after taking it.  You may take Tylenol ibuprofen every 6 hours as needed for pain.  Please use heat packs and rest the next few days and follow-up with your primary care provider.  Symptoms change or worsen please return to ER.

## 2022-12-30 NOTE — ED Provider Notes (Signed)
[place] EMERGENCY DEPARTMENT AT [facility] Provider Note   CSN: [id number] Arrival date & time: 12/30/22  1243     History  Chief Complaint  Patient presents with   Chest Pain   Cough    Kristin Huynh is a 47 y.o. female history of migraines, GERD presented for chest pain and shortness of breath that began 6 days ago.  Patient states that they recently went to [place] for a funeral on x-ray after that began having symptoms.  Patient states that she initially thought it was the mattress and change of the mattress is aggravating her back however still having symptoms.  Patient states that the chest pain does radiate to her back in a sharp fashion.  Patient denies any abdominal pain, nausea vomiting, fevers.  Patient does note that when he got back from a trip her right leg did appear swollen with some calf tenderness however that is since resolved.  Feels that this is very similar to previous pneumonia episode.    Home Medications Prior to Admission medications   Medication Sig Start Date End Date Taking? Authorizing Provider  cyclobenzaprine (FLEXERIL) 10 MG tablet Take 1 tablet (10 mg total) by mouth 2 (two) times daily as needed for muscle spasms. 12/30/22  Yes Taiana Temkin, Beverly Gust, PA-C  albuterol (VENTOLIN HFA) 108 (90 Base) MCG/ACT inhaler Inhale 1-2 puffs into the lungs every 6 (six) hours as needed for wheezing or shortness of breath. 12/10/19   Domenick Gong, MD  benzonatate (TESSALON) 200 MG capsule Take 1 capsule (200 mg total) by mouth 3 (three) times daily as needed for cough. 01/16/22   Redwine, Madison A, PA-C  HYDROcodone-acetaminophen (NORCO/VICODIN) 5-325 MG tablet Take 2 tablets by mouth every 6 (six) hours as needed for moderate pain. 09/04/21   Al Decant, PA-C  lidocaine (LIDODERM) 5 % Place 1 patch onto the skin daily. Remove & Discard patch within 12 hours or as directed by MD 01/16/22   Redwine, Madison A, PA-C  methocarbamol (ROBAXIN) 500  MG tablet Take 1 tablet (500 mg total) by mouth 2 (two) times daily. 01/16/22   Redwine, Madison A, PA-C  metoCLOPramide (REGLAN) 10 MG tablet Take 1 tablet (10 mg total) by mouth every 6 (six) hours as needed for nausea (or headache). 02/01/16   Dione Booze, MD  naproxen (NAPROSYN) 500 MG tablet Take 1 tablet (500 mg total) by mouth 2 (two) times daily. 01/16/22   Redwine, Madison A, PA-C  naproxen (NAPROSYN) 500 MG tablet Take 1 tablet (500 mg total) by mouth 2 (two) times daily. 03/03/22   Sabas Sous, MD  ondansetron (ZOFRAN-ODT) 4 MG disintegrating tablet Take 1 tablet (4 mg total) by mouth every 8 (eight) hours as needed for nausea or vomiting. 03/03/22   Sabas Sous, MD  Spacer/Aero-Holding Chambers (AEROCHAMBER PLUS) inhaler Use as instructed 12/10/19   Domenick Gong, MD      Allergies    Other, Penicillins, and Sumatriptan    Review of Systems   Review of Systems  Respiratory:  Positive for cough.   Cardiovascular:  Positive for chest pain.    Physical Exam Updated Vital Signs BP (!) 147/83 (BP Location: Left Arm)   Pulse 64   Temp 98.1 F (36.7 C) (Oral)   Resp 20   Ht 5\' 4"  (1.626 m)   Wt 72.6 kg   LMP 01/28/2016   SpO2 100%   BMI 27.47 kg/m  Physical Exam Vitals reviewed.  Constitutional:  General: She is not in acute distress.    Comments: Resting comfortably on her phone  HENT:     Head: Normocephalic and atraumatic.  Eyes:     Extraocular Movements: Extraocular movements intact.     Conjunctiva/sclera: Conjunctivae normal.     Pupils: Pupils are equal, round, and reactive to light.  Cardiovascular:     Rate and Rhythm: Normal rate and regular rhythm.     Pulses: Normal pulses.     Heart sounds: Normal heart sounds.     Comments: 2+ bilateral radial/dorsalis pedis pulses with regular rate Pulmonary:     Effort: Pulmonary effort is normal. No respiratory distress.     Breath sounds: Normal breath sounds.  Abdominal:     Palpations: Abdomen is  soft.     Tenderness: There is no abdominal tenderness. There is no guarding or rebound.  Musculoskeletal:        General: Normal range of motion.     Cervical back: Normal range of motion and neck supple.     Right lower leg: No edema.     Left lower leg: No edema.     Comments: 5 out of 5 bilateral grip/leg extension strength Right calf tenderness Able to reproduce patient's with palpation in the musculature of bilateral No midline tenderness or abnormalities palpated  Skin:    General: Skin is warm and dry.     Capillary Refill: Capillary refill takes less than 2 seconds.  Neurological:     General: No focal deficit present.     Mental Status: She is alert and oriented to person, place, and time.     Comments: Sensation intact in all 4 limbs  Psychiatric:        Mood and Affect: Mood normal.     ED Results / Procedures / Treatments   Labs (all labs ordered are listed, but only abnormal results are displayed) Labs Reviewed  BASIC METABOLIC PANEL - Abnormal; Notable for the following components:      Result Value   Potassium 3.4 (*)    All other components within normal limits  CBC WITH DIFFERENTIAL/PLATELET - Abnormal; Notable for the following components:   Platelets 427 (*)    All other components within normal limits  RESP PANEL BY RT-PCR (RSV, FLU A&B, COVID)  RVPGX2  BRAIN NATRIURETIC PEPTIDE  TROPONIN I (HIGH SENSITIVITY)  TROPONIN I (HIGH SENSITIVITY)    EKG None  Radiology CT Angio Chest PE W/Cm &/Or Wo Cm  Result Date: 12/30/2022 CLINICAL DATA:  Recent travel.  Chest pain radiating to back EXAM: CT ANGIOGRAPHY CHEST WITH CONTRAST TECHNIQUE: Multidetector CT imaging of the chest was performed using the standard protocol during bolus administration of intravenous contrast. Multiplanar CT image reconstructions and MIPs were obtained to evaluate the vascular anatomy. RADIATION DOSE REDUCTION: This exam was performed according to the departmental dose-optimization  program which includes automated exposure control, adjustment of the mA and/or kV according to patient size and/or use of iterative reconstruction technique. CONTRAST:  75mL OMNIPAQUE IOHEXOL 350 MG/ML SOLN COMPARISON:  None Available. FINDINGS: Cardiovascular: No filling defects within the pulmonary arteries to suggest acute pulmonary embolism. Mediastinum/Nodes: No axillary or supraclavicular adenopathy. No mediastinal or hilar adenopathy. No pericardial fluid. Esophagus normal. Lungs/Pleura: No pulmonary infarction. No pneumonia. No pleural fluid. No pneumothorax Upper Abdomen: Limited view of the liver, kidneys, pancreas are unremarkable. 3 mm LEFT renal calculus normal adrenal glands. Musculoskeletal: No aggressive osseous lesion. Review of the MIP images confirms the above findings.  IMPRESSION: 1. No evidence acute pulmonary embolism. 2. No acute findings in the chest. 3. LEFT nonobstructing nephrolithiasis. Electronically Signed   By: Genevive Bi M.D.   On: 12/30/2022 14:56   US Venous Img Lower Right (DVT Study)  Result Date: 12/30/2022 CLINICAL DATA:  Right lower extremity pain and edema for the past 3 days. Evaluate for DVT. EXAM: RIGHT LOWER EXTREMITY VENOUS DOPPLER ULTRASOUND TECHNIQUE: Gray-scale sonography with graded compression, as well as color Doppler and duplex ultrasound were performed to evaluate the lower extremity deep venous systems from the level of the common femoral vein and including the common femoral, femoral, profunda femoral, popliteal and calf veins including the posterior tibial, peroneal and gastrocnemius veins when visible. The superficial great saphenous vein was also interrogated. Spectral Doppler was utilized to evaluate flow at rest and with distal augmentation maneuvers in the common femoral, femoral and popliteal veins. COMPARISON:  None Available. FINDINGS: Contralateral Common Femoral Vein: Respiratory phasicity is normal and symmetric with the symptomatic side.  No evidence of thrombus. Normal compressibility. Common Femoral Vein: No evidence of thrombus. Normal compressibility, respiratory phasicity and response to augmentation. Saphenofemoral Junction: No evidence of thrombus. Normal compressibility and flow on color Doppler imaging. Profunda Femoral Vein: No evidence of thrombus. Normal compressibility and flow on color Doppler imaging. Femoral Vein: No evidence of thrombus. Normal compressibility, respiratory phasicity and response to augmentation. Popliteal Vein: No evidence of thrombus. Normal compressibility, respiratory phasicity and response to augmentation. Calf Veins: No evidence of thrombus. Normal compressibility and flow on color Doppler imaging. Superficial Great Saphenous Vein: No evidence of thrombus. Normal compressibility. Other Findings: There is no sonographic correlate for patient's area of discomfort involving the posterior aspect of the right calf (images 54 through 56). IMPRESSION: 1. No evidence of DVT within the right lower extremity. 2. No sonographic correlate for patient's area of discomfort involving the posterior aspect of the right calf. Specifically, no regional superficial thrombophlebitis. Electronically Signed   By: Simonne Come M.D.   On: 12/30/2022 14:41   DG Chest Port 1 View  Result Date: 12/30/2022 CLINICAL DATA:  Chest and back pain for 6 days. Cough and fever. Leg swelling travel. EXAM: PORTABLE CHEST 1 VIEW COMPARISON:  Radiographs 03/03/2022 and 01/16/2022. FINDINGS: 1319 hours. The heart size and mediastinal contours are normal. The lungs are clear. There is no pleural effusion or pneumothorax. No acute osseous findings are identified. Telemetry leads overlie the chest. IMPRESSION: No evidence of active cardiopulmonary process. Electronically Signed   By: Carey Bullocks M.D.   On: 12/30/2022 13:56    Procedures Procedures    Medications Ordered in ED Medications  iohexol (OMNIPAQUE) 350 MG/ML injection 75 mL (75 mLs  Intravenous Contrast Given 12/30/22 1420)  morphine (PF) 4 MG/ML injection 4 mg (4 mg Intravenous Given 12/30/22 1438)    ED Course/ Medical Decision Making/ A&P                                 Medical Decision Making Amount and/or Complexity of Data Reviewed Labs: ordered. Radiology: ordered.   Sirenity Kasperski 47 y.o. presented today for chest pain. Working DDx that I considered at this time includes, but not limited to, ACS, GERD, pulmonary embolism, community-acquired pneumonia, aortic dissection, pneumothorax, underlying bony abnormality, anemia, thyrotoxicosis, esophageal rupture, CHF exacerbation, valvular disorder, myocarditis, pericarditis, endocarditis, pericardial effusion/cardiac tamponade, pulmonary edema, gastritis/PUD, esophagitis.  R/o Dx:  ACS, GERD, pulmonary embolism, community-acquired pneumonia, aortic  dissection, pneumothorax, underlying bony abnormality, anemia, thyrotoxicosis, esophageal rupture, CHF exacerbation, valvular disorder, myocarditis, pericarditis, endocarditis, pericardial effusion/cardiac tamponade, pulmonary edema, gastritis/PUD, esophagitis: These are considered less likely due to history of present illness and physical exam findings.  PE: CTA negative  Aortic Dissection: less likely based on the location, quality, onset, and severity of symptoms in this case. Patient also has a lack of underlying history of AD or TAA.   Review of prior external notes: 03/02/2022 ED  Unique Tests and My Interpretation:  EKG: Rate, rhythm, axis, intervals all examined and without medically relevant abnormality. ST segments without concerns for elevations Troponin: 2 CXR: No acute cardiopulmonary changes CBC: Unremarkable BMP: Unremarkable CTA: Nonobstructing nephrolithiasis on the left side however no other abnormalities BNP: Negative Respiratory panel: Negative DVT study right leg: Negative  Discussion with Independent Historian: None  Discussion of Management of  Tests: None  Risk: Medium: prescription drug management  Risk Stratification Score: HEART:1  Plan: On exam patient was in no acute distress with stable vitals. Patient's physical did show right calf tenderness more patient stated that she had swelling after she traveled.  Given patient's description of her chest pain radiating to her back with shortness of breath and recent calf swelling with travel will get ultrasound along with CTA to rule out PE or clot.  Will obtain labs as well.  Labs are reassuring along with CT scan imaging.  CT does show incidental finding of kidney stone in the left kidney however this is a nonobstructing stone in the actual kidney and should not be causing patient any pain.  This was verbalized to the patient and patient understands.  At this time I have a high suspicion patient has muscle spasm pain and will prescribe Flexeril.  Patient does have primary care appointment tomorrow morning I encouraged her to make that appointment.  Encourage patient to take anti-inflammatories every 6 hours and use heat packs for her back and to remain mildly active but to avoid any heavy lifting.  Patient given work note.  Patient was given return precautions. Patient stable for discharge at this time.  Patient verbalized understanding of plan.  This chart was dictated using voice recognition software.  Despite best efforts to proofread,  errors can occur which can change the documentation meaning.         Final Clinical Impression(s) / ED Diagnoses Final diagnoses:  Muscle spasm of back  Nephrolithiasis    Rx / DC Orders ED Discharge Orders          Ordered    cyclobenzaprine (FLEXERIL) 10 MG tablet  2 times daily PRN        12/30/22 1538              Netta Corrigan, PA-C 12/30/22 1544    Melene Plan, DO 12/31/22 [phone]

## 2022-12-30 NOTE — ED Triage Notes (Signed)
Pt states sharp pain in chest and back that started 6 days ago  Thought it was her bed, got new mattress but it didn't help Sharp pain worse when laying down and SOB with laying down  States cough and fever at home last night   Reports h/o pneumonia and feels the same

## 2023-01-02 ENCOUNTER — Encounter: Payer: Self-pay | Admitting: Internal Medicine

## 2023-01-15 ENCOUNTER — Ambulatory Visit (AMBULATORY_SURGERY_CENTER): Payer: BC Managed Care – PPO

## 2023-01-15 VITALS — Ht 64.0 in | Wt 156.0 lb

## 2023-01-15 DIAGNOSIS — Z1211 Encounter for screening for malignant neoplasm of colon: Secondary | ICD-10-CM

## 2023-01-15 MED ORDER — NA SULFATE-K SULFATE-MG SULF 17.5-3.13-1.6 GM/177ML PO SOLN: 1.0000 | Freq: Once | ORAL | 0 refills | Status: AC

## 2023-01-15 NOTE — Progress Notes (Signed)
No egg or soy allergy known to patient  No issues known to pt with past sedation with any surgeries or procedures Patient denies ever being told they had issues or difficulty with intubation  No FH of Malignant Hyperthermia Pt is not on diet pills Pt is not on  home 02  Pt is not on blood thinners  Pt reports constipation in the past not a issue currently  No A fib or A flutter Have any cardiac testing pending--no  LOA: independent  Prep:   Patient's chart reviewed by Cathlyn Parsons CNRA prior to previsit and patient appropriate for the [facility].  Previsit completed and red dot placed by patient's name on their procedure day (on provider's schedule).     PV competed with patient. Prep instructions sent via mychart and home address. Goodrx coupon for [facility] provided to use for price reduction if needed.

## 2023-01-16 ENCOUNTER — Encounter: Payer: Self-pay | Admitting: Internal Medicine

## 2023-01-25 ENCOUNTER — Encounter: Payer: BC Managed Care – PPO | Admitting: Gastroenterology

## 2023-01-28 ENCOUNTER — Encounter: Payer: Self-pay | Admitting: Internal Medicine

## 2023-01-28 ENCOUNTER — Ambulatory Visit (AMBULATORY_SURGERY_CENTER): Payer: BC Managed Care – PPO | Admitting: Internal Medicine

## 2023-01-28 VITALS — BP 124/93 | HR 81 | Temp 96.8°F | Resp 18 | Ht 64.0 in | Wt 156.0 lb

## 2023-01-28 DIAGNOSIS — Z1211 Encounter for screening for malignant neoplasm of colon: Secondary | ICD-10-CM | POA: Diagnosis present

## 2023-01-28 MED ORDER — SODIUM CHLORIDE 0.9 % IV SOLN: 500.0000 mL | INTRAVENOUS | Status: DC

## 2023-01-28 NOTE — Progress Notes (Signed)
HISTORY OF PRESENT ILLNESS:  Kristin Huynh is a 47 y.o. female who is sent today for routine screening colonoscopy.  No complaints  REVIEW OF SYSTEMS:  All non-GI ROS negative except for  Past Medical History:  Diagnosis Date   GERD (gastroesophageal reflux disease)    Hx of migraine headaches     Past Surgical History:  Procedure Laterality Date   BREAST SURGERY     R breast mass removed- benign    Social History Kristin Huynh  reports that she has quit smoking. Her smoking use included cigarettes. She has never used smokeless tobacco. She reports current alcohol use. She reports that she does not use drugs.  family history includes Cancer in her mother; Diabetes in her father.  Allergies  Allergen Reactions   Other Hives    *Red Peppers*   Penicillins Hives    Other reaction(s): RASH   Sumatriptan Nausea And Vomiting       PHYSICAL EXAMINATION: Vital signs: BP (!) 150/99   Pulse 80   Temp (!) 96.8 F (36 C)   Ht 5\' 4"  (1.626 m)   Wt 156 lb (70.8 kg)   LMP 01/28/2016   SpO2 99%   BMI 26.78 kg/m  General: Well-developed, well-nourished, no acute distress HEENT: Sclerae are anicteric, conjunctiva pink. Oral mucosa intact Lungs: Clear Heart: Regular Abdomen: soft, nontender, nondistended, no obvious ascites, no peritoneal signs, normal bowel sounds. No organomegaly. Extremities: No edema Psychiatric: alert and oriented x3. Cooperative     ASSESSMENT:   Colon cancer screening  PLAN:  Screening colonoscopy

## 2023-01-28 NOTE — Progress Notes (Signed)
Pt's states no medical or surgical changes since previsit or office visit. 

## 2023-01-28 NOTE — Patient Instructions (Signed)
Resume previous diet and medications. Repeat Colonoscopy in 10 years for surveillance.  YOU HAD AN ENDOSCOPIC PROCEDURE TODAY AT THE [facility]:   Refer to the procedure report that was given to you for any specific questions about what was found during the examination.  If the procedure report does not answer your questions, please call your gastroenterologist to clarify.  If you requested that your care partner not be given the details of your procedure findings, then the procedure report has been included in a sealed envelope for you to review at your convenience later.  YOU SHOULD EXPECT: Some feelings of bloating in the abdomen. Passage of more gas than usual.  Walking can help get rid of the air that was put into your GI tract during the procedure and reduce the bloating. If you had a lower endoscopy (such as a colonoscopy or flexible sigmoidoscopy) you may notice spotting of blood in your stool or on the toilet paper. If you underwent a bowel prep for your procedure, you may not have a normal bowel movement for a few days.  Please Note:  You might notice some irritation and congestion in your nose or some drainage.  This is from the oxygen used during your procedure.  There is no need for concern and it should clear up in a day or so.  SYMPTOMS TO REPORT IMMEDIATELY:  Following lower endoscopy (colonoscopy or flexible sigmoidoscopy):  Excessive amounts of blood in the stool  Significant tenderness or worsening of abdominal pains  Swelling of the abdomen that is new, acute  Fever of 100F or higher  For urgent or emergent issues, a gastroenterologist can be reached at any hour by calling [phone]. Do not use MyChart messaging for urgent concerns.    DIET:  We do recommend a small meal at first, but then you may proceed to your regular diet.  Drink plenty of fluids but you should avoid alcoholic beverages for 24 hours.  ACTIVITY:  You should plan to take it easy for the  rest of today and you should NOT DRIVE or use heavy machinery until tomorrow (because of the sedation medicines used during the test).    FOLLOW UP: Our staff will call the number listed on your records the next business day following your procedure.  We will call around 7:15- 8:00 am to check on you and address any questions or concerns that you may have regarding the information given to you following your procedure. If we do not reach you, we will leave a message.     If any biopsies were taken you will be contacted by phone or by letter within the next 1-3 weeks.  Please call us at [phone] if you have not heard about the biopsies in 3 weeks.    SIGNATURES/CONFIDENTIALITY: You and/or your care partner have signed paperwork which will be entered into your electronic medical record.  These signatures attest to the fact that that the information above on your After Visit Summary has been reviewed and is understood.  Full responsibility of the confidentiality of this discharge information lies with you and/or your care-partner.  

## 2023-01-28 NOTE — Op Note (Addendum)
[facility] Patient Name: Kristin Huynh Procedure Date: 01/28/2023 2:51 PM MRN: [id number] Endoscopist: Wilhemina Bonito. Marina Goodell , MD, [id number] Age: 47 Referring MD:  Date of Birth: 19-Jan-1976 Gender: Female Account #: [id number] Procedure:                Colonoscopy Indications:              Screening for colorectal malignant neoplasm Medicines:                Monitored Anesthesia Care Procedure:                Pre-Anesthesia Assessment:                           - Prior to the procedure, a History and Physical                            was performed, and patient medications and                            allergies were reviewed. The patient's tolerance of                            previous anesthesia was also reviewed. The risks                            and benefits of the procedure and the sedation                            options and risks were discussed with the patient.                            All questions were answered, and informed consent                            was obtained. Prior Anticoagulants: The patient has                            taken no anticoagulant or antiplatelet agents. ASA                            Grade Assessment: II - A patient with mild systemic                            disease. After reviewing the risks and benefits,                            the patient was deemed in satisfactory condition to                            undergo the procedure.                           After obtaining informed consent, the colonoscope  was passed under direct vision. Throughout the                            procedure, the patient's blood pressure, pulse, and                            oxygen saturations were monitored continuously. The                            Olympus Scope SN: [id number] was introduced through                            the anus and advanced to the the cecum, identified                            by appendiceal  orifice and ileocecal valve. The                            terminal ileum, ileocecal valve, appendiceal                            orifice, and rectum were photographed. The quality                            of the bowel preparation was excellent. The                            colonoscopy was performed without difficulty. The                            patient tolerated the procedure well. The bowel                            preparation used was SUPREP via split dose                            instruction. Scope In: 3:02:17 PM Scope Out: 3:10:52 PM Scope Withdrawal Time: 0 hours 7 minutes 0 seconds  Total Procedure Duration: 0 hours 8 minutes 35 seconds  Findings:                 The terminal ileum appeared normal.                           The entire examined colon appeared normal on direct                            and retroflexion views. Complications:            No immediate complications. Estimated blood loss:                            None. Estimated Blood Loss:     Estimated blood loss: none. Impression:               - The examined portion of the  ileum was normal.                           - The entire examined colon is normal on direct and                            retroflexion views.                           - No specimens collected. Recommendation:           - Repeat colonoscopy in 10 years for screening                            purposes.                           - Patient has a contact number available for                            emergencies. The signs and symptoms of potential                            delayed complications were discussed with the                            patient. Return to normal activities tomorrow.                            Written discharge instructions were provided to the                            patient.                           - Resume previous diet.                           - Continue present medications. Wilhemina Bonito. Marina Goodell,  MD 01/28/2023 3:24:36 PM This report has been signed electronically.

## 2023-01-28 NOTE — Progress Notes (Signed)
Report to PACU, RN, vss, BBS= Clear.  

## 2023-01-29 ENCOUNTER — Telehealth: Payer: Self-pay

## 2023-01-29 NOTE — Telephone Encounter (Signed)
  Follow up Call-     01/28/2023    2:10 PM  Call back number  Post procedure Call Back phone  # [phone]  Permission to leave phone message Yes     Patient questions:  Do you have a fever, pain , or abdominal swelling? No. Pain Score  0 *  Have you tolerated food without any problems? Yes.    Have you been able to return to your normal activities? Yes.    Do you have any questions about your discharge instructions: Diet   No. Medications  No. Follow up visit  No.  Do you have questions or concerns about your Care? No.  Actions: * If pain score is 4 or above: No action needed, pain <4.
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
## 2023-11-11 ENCOUNTER — Ambulatory Visit: Admitting: Dermatology

## 2024-04-07 ENCOUNTER — Emergency Department (HOSPITAL_COMMUNITY)

## 2024-04-07 ENCOUNTER — Emergency Department (HOSPITAL_COMMUNITY)
Admission: EM | Admit: 2024-04-07 | Discharge: 2024-04-07 | Disposition: A | Discharge status: Home / Self Care | Attending: Emergency Medicine | Admitting: Emergency Medicine

## 2024-04-07 DIAGNOSIS — R109 Unspecified abdominal pain: Secondary | ICD-10-CM | POA: Diagnosis not present

## 2024-04-07 DIAGNOSIS — R059 Cough, unspecified: Secondary | ICD-10-CM | POA: Insufficient documentation

## 2024-04-07 DIAGNOSIS — R6883 Chills (without fever): Secondary | ICD-10-CM | POA: Diagnosis not present

## 2024-04-07 DIAGNOSIS — R61 Generalized hyperhidrosis: Secondary | ICD-10-CM | POA: Insufficient documentation

## 2024-04-07 DIAGNOSIS — Z79899 Other long term (current) drug therapy: Secondary | ICD-10-CM | POA: Diagnosis not present

## 2024-04-07 DIAGNOSIS — R112 Nausea with vomiting, unspecified: Secondary | ICD-10-CM | POA: Insufficient documentation

## 2024-04-07 LAB — URINALYSIS, ROUTINE W REFLEX MICROSCOPIC
Bilirubin Urine: NEGATIVE
Glucose, UA: NEGATIVE mg/dL
Hgb urine dipstick: NEGATIVE
Ketones, ur: NEGATIVE mg/dL
Leukocytes,Ua: NEGATIVE
Nitrite: NEGATIVE
Protein, ur: NEGATIVE mg/dL
Specific Gravity, Urine: 1.023 (ref 1.005–1.030)
pH: 7 (ref 5.0–8.0)

## 2024-04-07 LAB — URINE DRUG SCREEN
Amphetamines: NEGATIVE
Barbiturates: NEGATIVE
Benzodiazepines: NEGATIVE
Cocaine: NEGATIVE
Fentanyl: NEGATIVE
Methadone Scn, Ur: NEGATIVE
Opiates: NEGATIVE
Tetrahydrocannabinol: POSITIVE — AB

## 2024-04-07 LAB — CBC
HCT: 45.9 % (ref 36.0–46.0)
Hemoglobin: 15.1 g/dL — ABNORMAL HIGH (ref 12.0–15.0)
MCH: 29.8 pg (ref 26.0–34.0)
MCHC: 32.9 g/dL (ref 30.0–36.0)
MCV: 90.5 fL (ref 80.0–100.0)
Platelets: 577 K/uL — ABNORMAL HIGH (ref 150–400)
RBC: 5.07 MIL/uL (ref 3.87–5.11)
RDW: 14.2 % (ref 11.5–15.5)
WBC: 7 K/uL (ref 4.0–10.5)
nRBC: 0 % (ref 0.0–0.2)

## 2024-04-07 LAB — COMPREHENSIVE METABOLIC PANEL WITH GFR
ALT: 15 U/L (ref 0–44)
AST: 20 U/L (ref 15–41)
Albumin: 4.3 g/dL (ref 3.5–5.0)
Alkaline Phosphatase: 84 U/L (ref 38–126)
Anion gap: 9 (ref 5–15)
BUN: 10 mg/dL (ref 6–20)
CO2: 26 mmol/L (ref 22–32)
Calcium: 10 mg/dL (ref 8.9–10.3)
Chloride: 102 mmol/L (ref 98–111)
Creatinine, Ser: 0.68 mg/dL (ref 0.44–1.00)
GFR, Estimated: >60 mL/min
Glucose, Bld: 99 mg/dL (ref 70–99)
Potassium: 4.5 mmol/L (ref 3.5–5.1)
Sodium: 136 mmol/L (ref 135–145)
Total Bilirubin: 0.2 mg/dL (ref 0.0–1.2)
Total Protein: 8.9 g/dL — ABNORMAL HIGH (ref 6.5–8.1)

## 2024-04-07 LAB — LIPASE, BLOOD: Lipase: 15 U/L (ref 11–51)

## 2024-04-07 LAB — PREGNANCY, URINE: Preg Test, Ur: NEGATIVE

## 2024-04-07 MED ORDER — ALUM & MAG HYDROXIDE-SIMETH 200-200-20 MG/5ML PO SUSP
30.0000 mL | Freq: Once | ORAL | Status: AC
  Administered 2024-04-07: 30 mL via ORAL
  Filled 2024-04-07: qty 30

## 2024-04-07 MED ORDER — ONDANSETRON HCL 4 MG/2ML IJ SOLN
4.0000 mg | Freq: Once | INTRAMUSCULAR | Status: AC
  Administered 2024-04-07: 4 mg via INTRAVENOUS
  Filled 2024-04-07: qty 2

## 2024-04-07 MED ORDER — PANTOPRAZOLE SODIUM 20 MG PO TBEC
20.0000 mg | DELAYED_RELEASE_TABLET | Freq: Once | ORAL | Status: AC
  Administered 2024-04-07: 20 mg via ORAL
  Filled 2024-04-07: qty 1

## 2024-04-07 MED ORDER — DICYCLOMINE HCL 20 MG PO TABS: 20.0000 mg | ORAL_TABLET | Freq: Two times a day (BID) | ORAL | 0 refills | Status: AC

## 2024-04-07 MED ORDER — MORPHINE SULFATE (PF) 4 MG/ML IV SOLN
4.0000 mg | Freq: Once | INTRAVENOUS | Status: AC
  Administered 2024-04-07: 4 mg via INTRAVENOUS
  Filled 2024-04-07: qty 1

## 2024-04-07 MED ORDER — ONDANSETRON 4 MG PO TBDP: 4.0000 mg | ORAL_TABLET | Freq: Three times a day (TID) | ORAL | 0 refills | Status: AC | PRN

## 2024-04-07 MED ORDER — FAMOTIDINE 20 MG PO TABS: 20.0000 mg | ORAL_TABLET | Freq: Two times a day (BID) | ORAL | 0 refills | Status: AC

## 2024-04-07 MED ORDER — ONDANSETRON 4 MG PO TBDP
4.0000 mg | ORAL_TABLET | Freq: Once | ORAL | Status: AC
  Administered 2024-04-07: 4 mg via ORAL
  Filled 2024-04-07: qty 1

## 2024-04-07 MED ORDER — IOHEXOL 300 MG/ML  SOLN
100.0000 mL | Freq: Once | INTRAMUSCULAR | Status: AC | PRN
  Administered 2024-04-07: 100 mL via INTRAVENOUS

## 2024-04-07 NOTE — ED Provider Notes (Signed)
 "  EMERGENCY DEPARTMENT AT [facility] Provider Note   CSN: [id number] Arrival date & time: 04/07/24  9066     Patient presents with: Emesis   Kristin Huynh is a 49 y.o. female with no pertinent past medical history, who presents emergency department for evaluation of nausea and vomiting.  Patient reports she has been vomiting for the last 7 days and has been unable to keep down food or liquids.  She states that she thought she was initially getting better, but then she worsened a couple of days ago.  Patient and is reporting sweats, chills and cough.  She states she has vomited 3-4 times this morning.  She denies any hematemesis.  Patient does report some abdominal pain, which is improved after vomiting.  She denies any known sick contacts.   Emesis      Prior to Admission medications  Medication Sig Start Date End Date Taking? Authorizing Provider  albuterol  (VENTOLIN  HFA) 108 (90 Base) MCG/ACT inhaler Inhale 1-2 puffs into the lungs every 6 (six) hours as needed for wheezing or shortness of breath. Patient not taking: Reported on 01/15/2023 12/10/19   Van Knee, MD  benzonatate  (TESSALON ) 200 MG capsule Take 1 capsule (200 mg total) by mouth 3 (three) times daily as needed for cough. Patient not taking: Reported on 01/15/2023 01/16/22   Redwine, Madison A, PA-C  busPIRone (BUSPAR) 5 MG tablet Take 5 mg by mouth daily as needed. 01/09/23   [provider]  cyclobenzaprine  (FLEXERIL ) 10 MG tablet Take 1 tablet (10 mg total) by mouth 2 (two) times daily as needed for muscle spasms. 12/30/22   Victor Lynwood DASEN, PA-C  HYDROcodone -acetaminophen  (NORCO/VICODIN) 5-325 MG tablet Take 2 tablets by mouth every 6 (six) hours as needed for moderate pain. Patient not taking: Reported on 01/15/2023 09/04/21   Ruthell Lonni FALCON, PA-C  metoCLOPramide  (REGLAN ) 10 MG tablet Take 1 tablet (10 mg total) by mouth every 6 (six) hours as needed for nausea (or headache). Patient  not taking: Reported on 01/15/2023 02/01/16   Raford Lenis, MD  naproxen  (NAPROSYN ) 500 MG tablet Take 1 tablet (500 mg total) by mouth 2 (two) times daily. Patient not taking: Reported on 01/15/2023 01/16/22   Redwine, Madison A, PA-C  naproxen  (NAPROSYN ) 500 MG tablet Take 1 tablet (500 mg total) by mouth 2 (two) times daily. Patient not taking: Reported on 01/15/2023 03/03/22   Theadore Ozell HERO, MD  ondansetron  (ZOFRAN -ODT) 4 MG disintegrating tablet Take 1 tablet (4 mg total) by mouth every 8 (eight) hours as needed for nausea or vomiting. Patient not taking: Reported on 01/15/2023 03/03/22   Theadore Ozell HERO, MD    Allergies: Other, Penicillins, and Sumatriptan     Review of Systems  Gastrointestinal:  Positive for vomiting.    Updated Vital Signs BP 138/85   Pulse 64   Temp 98.3 F (36.8 C) (Oral)   Resp 18   LMP 01/28/2016   SpO2 98%   Physical Exam Vitals and nursing note reviewed.  Constitutional:      Appearance: Normal appearance.  HENT:     Head: Normocephalic and atraumatic.     Mouth/Throat:     Mouth: Mucous membranes are moist.  Eyes:     General: No scleral icterus.       Right eye: No discharge.        Left eye: No discharge.     Conjunctiva/sclera: Conjunctivae normal.  Cardiovascular:     Rate and Rhythm: Normal rate  and regular rhythm.     Pulses: Normal pulses.  Pulmonary:     Effort: Pulmonary effort is normal.     Breath sounds: Normal breath sounds.  Abdominal:     General: There is no distension.     Tenderness: There is abdominal tenderness. There is no right CVA tenderness or left CVA tenderness.     Comments: Patient with significant tenderness to the epigastrium  Musculoskeletal:        General: No deformity.     Cervical back: Normal range of motion.  Skin:    General: Skin is warm and dry.     Capillary Refill: Capillary refill takes less than 2 seconds.  Neurological:     Mental Status: She is alert.     Motor: No weakness.   Psychiatric:        Mood and Affect: Mood normal.     (all labs ordered are listed, but only abnormal results are displayed) Labs Reviewed  COMPREHENSIVE METABOLIC PANEL WITH GFR - Abnormal; Notable for the following components:      Result Value   Total Protein 8.9 (*)    All other components within normal limits  CBC - Abnormal; Notable for the following components:   Hemoglobin 15.1 (*)    Platelets 577 (*)    All other components within normal limits  LIPASE, BLOOD  URINALYSIS, ROUTINE W REFLEX MICROSCOPIC  PREGNANCY, URINE  URINE DRUG SCREEN  POC URINE PREG, ED    EKG: None  Radiology: No results found.  Procedures   Medications Ordered in the ED  ondansetron  (ZOFRAN -ODT) disintegrating tablet 4 mg (4 mg Oral Given 04/07/24 1200)  alum & mag hydroxide-simeth (MAALOX/MYLANTA) 200-200-20 MG/5ML suspension 30 mL (30 mLs Oral Given 04/07/24 1200)  pantoprazole  (PROTONIX ) EC tablet 20 mg (20 mg Oral Given 04/07/24 1200)  morphine  (PF) 4 MG/ML injection 4 mg (4 mg Intravenous Given 04/07/24 1400)  iohexol  (OMNIPAQUE ) 300 MG/ML solution 100 mL (100 mLs Intravenous Contrast Given 04/07/24 1424)                                 Medical Decision Making Amount and/or Complexity of Data Reviewed Labs: ordered. Radiology: ordered.  Risk OTC drugs. Prescription drug management.   This patient presents to the ED for concern of vomiting, this involves an extensive number of treatment options, and is a complaint that carries with it a high risk of complications and morbidity.   Differential diagnosis includes: GERD, pregnancy, ovarian torsion, pancreatitis, cholecystitis, UTI, pyelonephritis, STI, cannabinoid hyperemesis  Co morbidities:  none  Lab Tests:  I Ordered, and personally interpreted labs.  The pertinent results include: No acute abnormalities to explain patient's symptoms.  UDS is pending  Imaging Studies:  I ordered imaging studies including CT abdomen pelvis I  independently visualized and interpreted imaging which is currently pending I agree with the radiologist interpretation  Cardiac Monitoring/ECG:  The patient was maintained on a cardiac monitor.  I personally viewed and interpreted the cardiac monitored which showed an underlying rhythm of: Sinus rhythm  Medicines ordered and prescription drug management:  I ordered medication including  Medications  ondansetron  (ZOFRAN -ODT) disintegrating tablet 4 mg (4 mg Oral Given 04/07/24 1200)  alum & mag hydroxide-simeth (MAALOX/MYLANTA) 200-200-20 MG/5ML suspension 30 mL (30 mLs Oral Given 04/07/24 1200)  pantoprazole  (PROTONIX ) EC tablet 20 mg (20 mg Oral Given 04/07/24 1200)  morphine  (PF) 4 MG/ML injection 4  mg (4 mg Intravenous Given 04/07/24 1400)  iohexol  (OMNIPAQUE ) 300 MG/ML solution 100 mL (100 mLs Intravenous Contrast Given 04/07/24 1424)   for pain control and suspected gastritis Reevaluation of the patient after these medicines showed that the patient improved I have reviewed the patients home medicines and have made adjustments as needed  Test Considered:   none  Critical Interventions:   none  Consultations Obtained: None  Problem List / ED Course:     ICD-10-CM   1. Nausea and vomiting, unspecified vomiting type  R11.2       MDM: 49 year old female who presents emergency department for evaluation of vomiting.  Patient lab work is grossly unremarkable with no explanation for her symptoms.  Initially due to her epigastric tenderness, I treated her with a GI cocktail.  However, upon my reassessment patient is lying in the fetal position and in significant distress due to her pain.  Therefore I proceeded with a CT of her abdomen and pelvis.  Results are currently pending.  I also give the patient a dose of morphine  for pain control.  Upon reassessment, patient reports her pain has significantly improved.  CT results are pending at this time.  Handoff given to [place], PA-C, who will  likely end up discharging this patient with supportive care.   Dispostion:  After consideration of the diagnostic results and the patients response to treatment, I feel that the patient would benefit from supportive care.   Final diagnoses:  Nausea and vomiting, unspecified vomiting type    ED Discharge Orders     None          Torrence Marry RAMAN, [place] 04/07/24 1510  "

## 2024-04-07 NOTE — Discharge Instructions (Addendum)
 Zofran  30 min before breakfast, lunch, dinner.  Hydrate

## 2024-04-07 NOTE — ED Triage Notes (Signed)
 Flu symptoms starting 7 days ago. Has been taking OTC meds. After 4 days its been keeping her awake, sweats, chills, cough. Can't keep food down - N/V. Threw up 3-4 times this morning. Abd pain before throwing up which improved.

## 2024-04-07 NOTE — ED Notes (Signed)
 Pt states she feels much better and was able to tolerate PO challenge w/o difficulty. Provider notified.

## 2024-04-07 NOTE — ED Notes (Signed)
 Provided pt with crackers, peanut butter, and sprite for PO challenge.

## 2024-06-17 ENCOUNTER — Ambulatory Visit: Admitting: Dermatology
